# Patient Record
Sex: Female | Born: 1940 | Race: White | Hispanic: No | Marital: Married | Smoking: Never smoker
Health system: Southern US, Community
[De-identification: ages and names within clinical notes are randomized; demographics above are authoritative.]

## PROBLEM LIST (undated history)

## (undated) DIAGNOSIS — M1990 Unspecified osteoarthritis, unspecified site: Secondary | ICD-10-CM

## (undated) DIAGNOSIS — I499 Cardiac arrhythmia, unspecified: Secondary | ICD-10-CM

## (undated) DIAGNOSIS — I10 Essential (primary) hypertension: Secondary | ICD-10-CM

## (undated) DIAGNOSIS — C801 Malignant (primary) neoplasm, unspecified: Secondary | ICD-10-CM

---

## 2008-11-22 HISTORY — PX: JOINT REPLACEMENT: SHX530

## 2009-07-23 ENCOUNTER — Inpatient Hospital Stay (HOSPITAL_COMMUNITY): Admission: RE | Admit: 2009-07-23 | Discharge: 2009-07-26 | Payer: Self-pay | Admitting: Orthopedic Surgery

## 2010-12-13 IMAGING — CR DG CHEST 2V
2 series · 2 of 2 positions shown · non-contrast
Comparison: None

CLINICAL DATA: Preoperative respiratory exam for knee surgery.
Hypertension.  History of pneumonia.

CHEST - 2 VIEW

[view not recorded (1 of 2)]
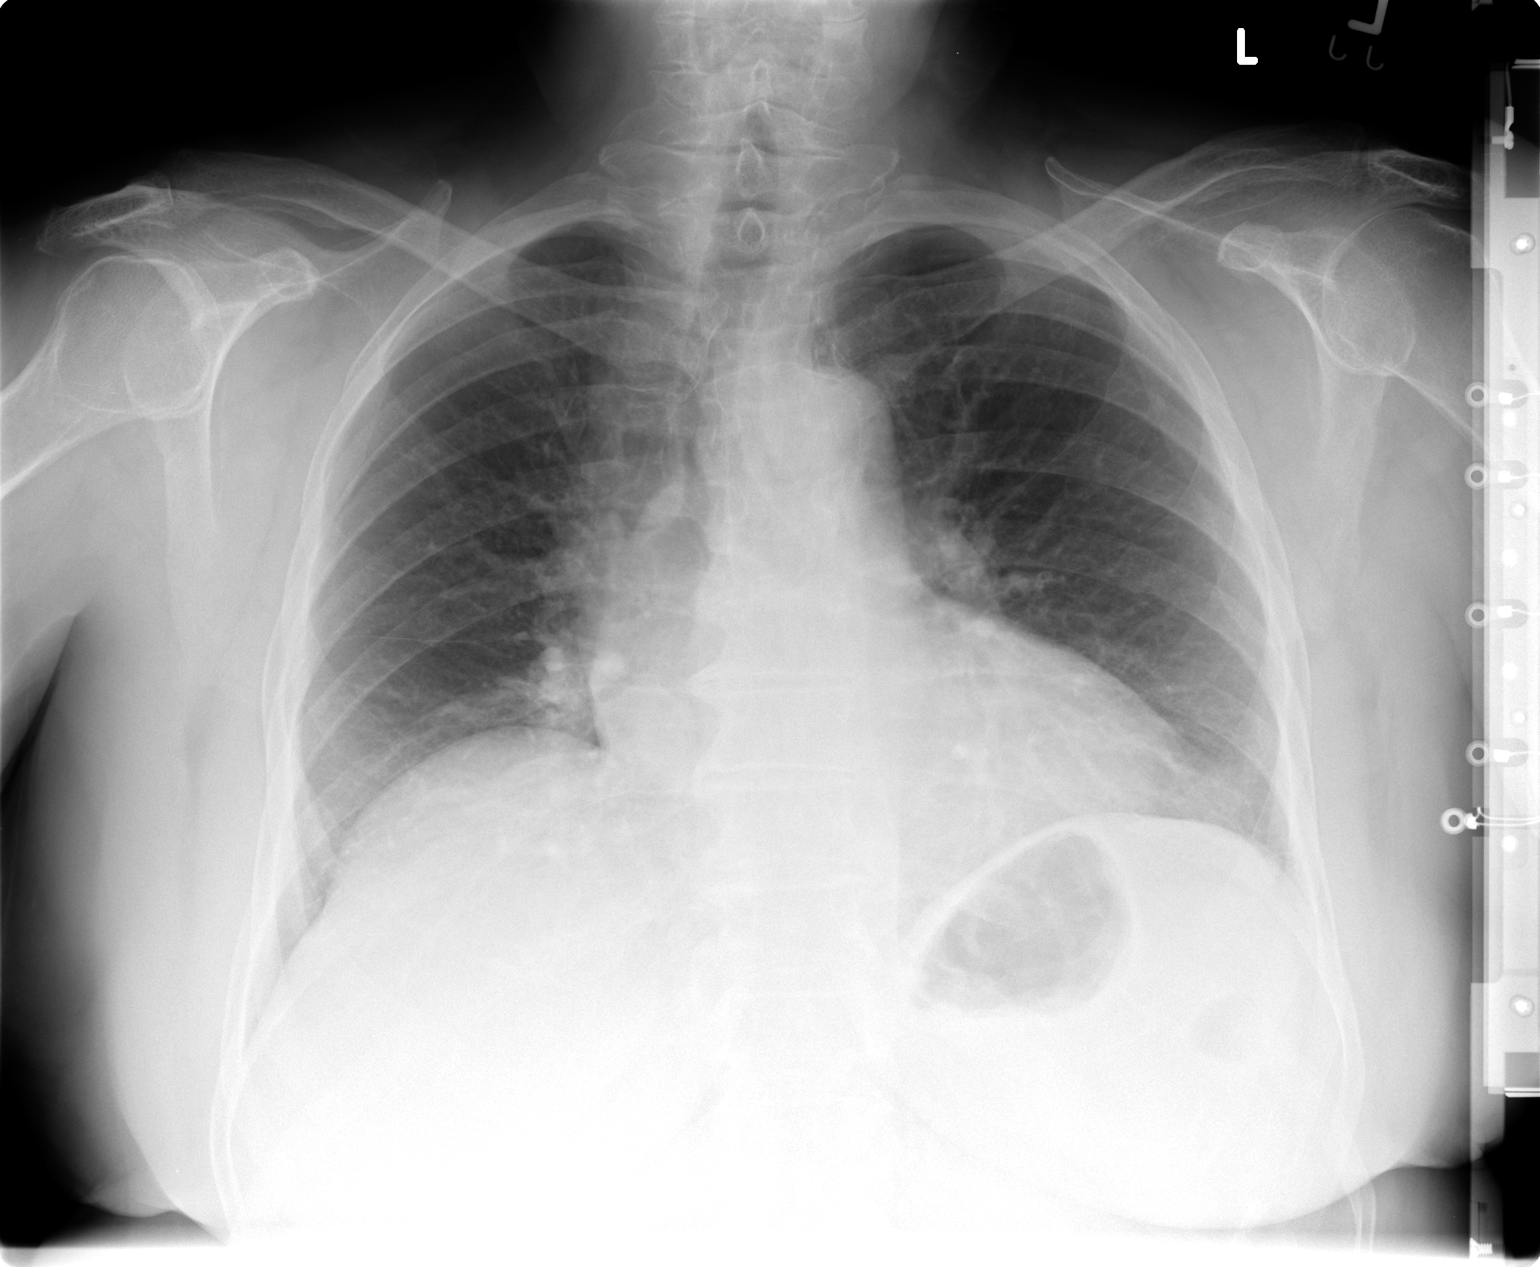

[view not recorded (2 of 2)]
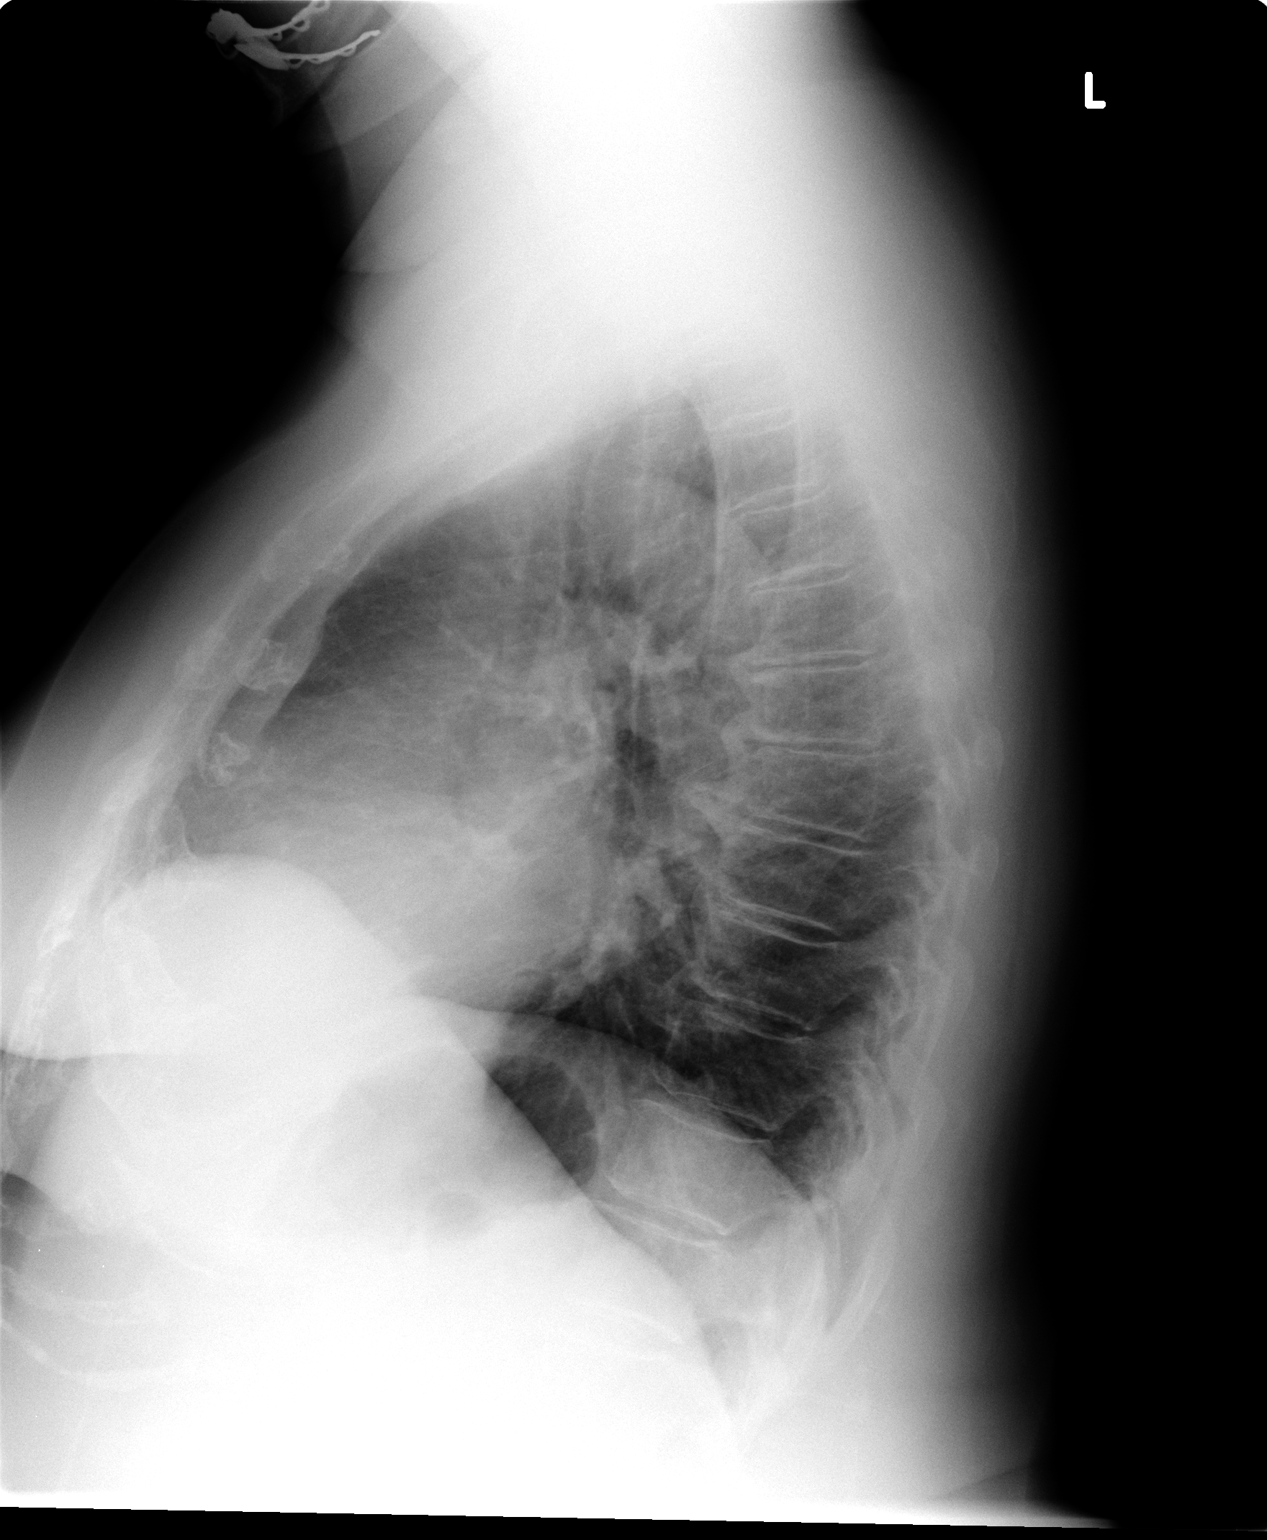

[2 of 2 positions shown; findings below may reference images not displayed]

FINDINGS: Heart size is at the upper limits of normal.  The
mediastinum is unremarkable.  Lungs are clear.  No effusions.
Ordinary degenerative changes effect the spine.
IMPRESSION: No active disease

## 2011-02-26 LAB — TYPE AND SCREEN: ABO/RH(D): O NEG

## 2011-02-26 LAB — BASIC METABOLIC PANEL
BUN: 10 mg/dL (ref 6–23)
BUN: 13 mg/dL (ref 6–23)
Calcium: 8.6 mg/dL (ref 8.4–10.5)
Calcium: 8.8 mg/dL (ref 8.4–10.5)
Chloride: 103 mEq/L (ref 96–112)
Creatinine, Ser: 0.79 mg/dL (ref 0.4–1.2)
Creatinine, Ser: 0.8 mg/dL (ref 0.4–1.2)
Creatinine, Ser: 0.82 mg/dL (ref 0.4–1.2)
GFR calc Af Amer: >60 mL/min (ref >60)
GFR calc non Af Amer: >60 mL/min (ref >60)
Glucose, Bld: 132 mg/dL — ABNORMAL HIGH (ref 70–99)
Sodium: 133 mEq/L — ABNORMAL LOW (ref 135–145)

## 2011-02-26 LAB — CBC
Hemoglobin: 9.6 g/dL — ABNORMAL LOW (ref 12.0–15.0)
MCV: 89.5 fL (ref 78.0–100.0)
Platelets: 150 10*3/uL (ref 150–400)
Platelets: 204 10*3/uL (ref 150–400)
Platelets: 213 10*3/uL (ref 150–400)
RBC: 3.01 MIL/uL — ABNORMAL LOW (ref 3.87–5.11)
RDW: 13.6 % (ref 11.5–15.5)
WBC: 8.4 10*3/uL (ref 4.0–10.5)
WBC: 9.7 10*3/uL (ref 4.0–10.5)

## 2011-02-26 LAB — PROTIME-INR
INR: 1.4 (ref 0.00–1.49)
INR: 1.5 (ref 0.00–1.49)
Prothrombin Time: 13.5 seconds (ref 11.6–15.2)
Prothrombin Time: 17.1 seconds — ABNORMAL HIGH (ref 11.6–15.2)
Prothrombin Time: 18.2 seconds — ABNORMAL HIGH (ref 11.6–15.2)

## 2011-02-26 LAB — ABO/RH: ABO/RH(D): O NEG

## 2011-02-27 LAB — COMPREHENSIVE METABOLIC PANEL
ALT: 15 U/L (ref 0–35)
Calcium: 9.4 mg/dL (ref 8.4–10.5)
Creatinine, Ser: 0.83 mg/dL (ref 0.4–1.2)
GFR calc non Af Amer: >60 mL/min (ref >60)
Glucose, Bld: 92 mg/dL (ref 70–99)
Sodium: 137 mEq/L (ref 135–145)
Total Protein: 7.2 g/dL (ref 6.0–8.3)

## 2011-02-27 LAB — URINALYSIS, ROUTINE W REFLEX MICROSCOPIC
Bilirubin Urine: NEGATIVE
Nitrite: POSITIVE — AB
Protein, ur: NEGATIVE mg/dL
Specific Gravity, Urine: 1.017 (ref 1.005–1.030)
Urobilinogen, UA: 0.2 mg/dL (ref 0.0–1.0)

## 2011-02-27 LAB — URINE MICROSCOPIC-ADD ON

## 2011-02-27 LAB — CBC
Hemoglobin: 12.7 g/dL (ref 12.0–15.0)
MCHC: 34.3 g/dL (ref 30.0–36.0)
MCV: 89 fL (ref 78.0–100.0)
RDW: 13.9 % (ref 11.5–15.5)

## 2011-02-27 LAB — PROTIME-INR
INR: 0.9 (ref 0.00–1.49)
Prothrombin Time: 12.1 seconds (ref 11.6–15.2)

## 2011-02-27 LAB — APTT: aPTT: 24 seconds (ref 24–37)

## 2011-04-06 NOTE — H&P (Signed)
Lisa Sims, Lisa Sims                  ACCOUNT NO.:  [id number]   MEDICAL RECORD NO.:  [id number]          PATIENT TYPE:  INP   LOCATION:  NA                           FACILITY:  [facility]   PHYSICIAN:  Ollen Gross, M.D.    DATE OF BIRTH:  1941/04/06   DATE OF ADMISSION:  DATE OF DISCHARGE:                              HISTORY & PHYSICAL   CHIEF COMPLAINT:  Bilateral knee pain worse in the left than the right.   HISTORY OF PRESENT ILLNESS:  Lisa Sims has been followed by Dr. Lequita Halt  for worsening bilateral knee pain.  The patient states that the pain  seems to be worse in her left knee than in her right.  The patient  states that at this point her knee pain is preventing her from doing  things.  She is here today to discuss options about her future.  X-rays  of the patient's knees, AP and lateral, reveal significant end-stage  arthritis in both knees that happens to be worse on the left than the  right.  Patient is bone-on-bone.  The patient now presents for left  total knee arthroplasty to be performed by Dr. Lequita Halt on July 23, 2009, at [facility].   ALLERGIES:  1. VICODIN causes nausea, vomiting and dizziness.  2. The patient is able to take Percocet.  3. The patient denies any food, latex or metal allergies.   CURRENT MEDICATIONS:  1. Baclofen 10 mg 1/2 tablet p.o. t.i.d.  2. Lisinopril 10 mg 1 tablet p.o. daily.  3. Hydrochloride 125 mg 1 tablet p.o. daily.  4. Fish Oil 1200 mg 1 tablet p.o. b.i.d.  5. Calcium citrate 600 mg 1 tablet p.o. daily.  6. Garlic 1000 mg 1 tablet p.o. daily.  Note, discussed with patient that she is to discontinue use of all  supplements 7 days prior to surgery.   PAST MEDICAL HISTORY:  1. Trigeminal neuralgia.  2. Dentures - full set on top, partial on bottom.  3. Hypertension.  4. Heart murmur.  5. Hyperlipidemia.  6. Bronchitis.  7. Pneumonia.  8. Varicose veins.  9. Hemorrhoids.  10.Arthritis.   REVIEW OF SYSTEMS:   GENERAL:  Negative for weight change, negative for  fevers, negative for night sweats.  HEENT/NEURO:  Positive for  headaches.  The patient notes that this is occasional and is most often  caused by her trigeminal neuralgia.  Negative for double vision,  negative for balance problems.  RESPIRATORY:  Negative for shortness of  breath at rest or with exertion.  Negative for cough or wheezing.  CARDIOVASCULAR:  Negative for chest pain, palpation.  The patient's last  electrocardiogram was this month.  The patient was then sent to the  hospital for an echocardiogram which was negative.  GASTROINTESTINAL:  Negative for nausea, vomiting, diarrhea, negative for blood in the  stool.  GENITOURINARY: Negative for painful urination, positive for a  weak stream and positive for urinating at nighttime.  MUSCULOSKELETAL:  Positive for joint pain and joint swelling, positive for morning  stiffness.   PAST SURGICAL HISTORY:  None.   FAMILY MEDICAL HISTORY:  Father 35, passed away of a myocardial  infarction.  Mother 44, passed away Alzheimer's, otherwise good health.   SOCIAL HISTORY:  The patient is married.  She is retired.  The patient  lives at home with her husband.  After surgery the patient plans to go  home.  She has caregiver lined up, it will be her daughter and her  husband.   PHYSICAL EXAMINATION:  VITAL SIGNS:  Pulse 78, respirations 18, blood  pressure 150/82.  GENERAL:  A 70 year old white female well-developed, well-nourished, in  no acute distress.  She is alert and oriented x3 and she is a good  historian.  HEENT:  Positive for glasses, positive for dentures, otherwise  unremarkable.  NECK:  Supple.  Full range of motion without lymphadenopathy.  CHEST:  Lungs are clear to auscultation bilaterally without wheezes.  HEART:  Regular rate and rhythm without murmur.  ABDOMEN:  Soft, nontender, nondistended.  Bowel sounds are present in  all 4 quadrants.  EXTREMITIES:  Left knee  marked crepitus on range of motion.  No  effusion.  Range is about 5-120 degrees.  No instability with palpation.  SKIN:  Unremarkable.  NEUROLOGIC:  Sensation in lower extremities is intact bilaterally.  PERIPHERAL VASCULAR:  Carotid pulses 2+ bilaterally without bruit.  Dorsalis pedis pulses 2+ bilaterally.   IMPRESSION:  End-stage arthritis bilateral knees, worse on the left than  the right.   PLAN OF ACTION:  Left total knee arthroplasty to be performed by Dr.  Lequita Halt July 23, 2009, at [facility].  The patient will  undergo all preoperative labs and testing at [facility].   PRIMARY CARE PHYSICIAN:  Dr. Earlene Plater.   CARDIOLOGIST:  Dr. Alferd Patee.      Lisa Sims, [facility]      Ollen Gross, M.D.  Electronically Signed    LD/MEDQ  D:  07/22/2009  T:  07/22/2009  Job:  [id number]

## 2011-04-06 NOTE — Op Note (Signed)
Lisa Sims, Lisa Sims                  ACCOUNT NO.:  [id number]   MEDICAL RECORD NO.:  [id number]          PATIENT TYPE:  INP   LOCATION:  0002                         [facility]ACILITY[facility]   PHYSICIAN:  Ollen Gross, M.D.    DATE OF BIRTH:  13-Oct-1941   DATE OF PROCEDURE:  07/23/2009  DATE OF DISCHARGE:                               OPERATIVE REPORT   PREOPERATIVE DIAGNOSIS:  Osteoarthritis, left knee.   POSTOPERATIVE DIAGNOSIS:  Osteoarthritis, left knee.   PROCEDURE:  Left total knee arthroplasty.   SURGEON:  Ollen Gross, M.D.   ASSISTANT:  Avel Peace PA-C.   ANESTHESIA:  Spinal with Duramorph.   ESTIMATED BLOOD LOSS:  Minimal.   DRAINS:  None.   TOURNIQUET TIME:  33 minutes at 300 mmHg.   COMPLICATIONS:  None.   CONDITION:  Stable to recovery room.   BRIEF CLINICAL NOTE:  Lisa Sims is a 70 year old female who has severe  end-stage arthritis of the left knee with progressively worsening pain  and dysfunction.  She has failed nonoperative management and presents  now for left total knee arthroplasty.   PROCEDURE IN DETAIL:  After successful administration of spinal  anesthetic, a tourniquet is placed on her left thigh and left lower  extremity is prepped and draped in the usual sterile fashion.  Extremity  is wrapped in Esmarch, knee flexed, tourniquet inflated to 300 mmHg.  Midline incision was made with a 10 blade through subcutaneous tissue to  the level of the extensor mechanism.  A fresh blade is used make a  medial parapatellar arthrotomy.  Soft tissue on the proximal medial  tibia is periosteally elevated to the joint line with the knife and the  semimembranosus bursa with a Cobb elevator.  Soft tissue laterally is  elevated with attention being paid to avoiding the patellar tendon on  the tibial tubercle.  The patella is subluxed laterally, knee flexed 90  degrees, and ACL and PCL removed.  Drill was used to create a starting  hole in the distal femur and the  canal was thoroughly irrigated.  A 5  degree left valgus alignment guide is placed referencing off the  posterior condyles.  Rotations marked and the block pinned to remove 11  mm of the distal femur.  Distal femoral resection is made with an  oscillating saw.  Sizing block is placed, size 3 is most appropriate.  Rotations marked off the epicondylar axis.  Size 3 cutting block is  placed and the anterior, posterior and chamfer cuts were made.   Tibia subluxed forward and menisci removed.  Extramedullary tibial  alignment guide is placed referencing proximally at the medial aspect of  the tibial tubercle and distally along the second metatarsal axis and  tibial crest.  Blocks pinned to remove about 2 mm off the more deficient  medial side.  Tibial resection is made with an oscillating saw.  Size 4  is the most appropriate tibial component and the proximal tibia is  prepared with the modular drill and keel punch for the size 4.  Femoral  preparation was completed  the intercondylar cut for size 3.   Size 4 mobile bearing tibial trial, size 3 posterior stabilized femoral  trial and a 10-mm posterior stabilized rotating platform insert trial  are placed.  With the 10, full extension is achieved with excellent  varus-valgus and anterior-posterior balance throughout full range of  motion.  Patella was everted, thickness measured to 20 mm.  Freehand  resection taken to 12 mm, 35 template is placed, lug holes were drilled,  trial patella was placed and it tracks normally.  Osteophytes removed  off the posterior femur with the trial in place.  All trials were  removed and the cut bone surfaces are prepared with pulsatile lavage.  Cement was mixed and once ready for implantation the size 4 mobile  bearing tibial tray,size 3 posterior stabilized femur and 35 patella are  cemented into place.  The patella was held with a clamp.  Trial 10-mm  inserts placed, knee held in full extension and all  extruded cement  removed.  When the cement is fully hardened, then the permanent 10 mm  posterior stabilized rotating platform insert is placed in the tibial  tray.  Wounds copiously irrigated with saline solution and then the  FloSeal injected into the medial and lateral gutters and suprapatellar  area.  Moist sponge is placed and a tourniquet released for a total time  of 33 minutes.  Sponge is held for 2 minutes then removed.  Minimal  bleeding was encountered.  Bleeding that is encountered stopped with  electrocautery.  Wounds again irrigated and the arthrotomy was closed  with interrupted #1 PDS.  Flexion against gravity is 140 degrees.  Subcu  is then closed with interrupted 2-0 Vicryl and subcuticular running 4-0  Monocryl.  The incision was then cleaned and dried and Steri-Strips and  bulky sterile dressing were applied.  She is then placed into a knee  immobilizer, awakened and transferred to recovery in stable condition.      Ollen Gross, M.D.  Electronically Signed     FA/MEDQ  D:  07/23/2009  T:  07/23/2009  Job:  [id number]

## 2011-11-23 DIAGNOSIS — C801 Malignant (primary) neoplasm, unspecified: Secondary | ICD-10-CM

## 2011-11-23 HISTORY — DX: Malignant (primary) neoplasm, unspecified: C80.1

## 2017-10-30 ENCOUNTER — Ambulatory Visit: Payer: Self-pay | Admitting: Orthopedic Surgery

## 2017-12-07 ENCOUNTER — Encounter (HOSPITAL_COMMUNITY): Admission: RE | Admit: 2017-12-07 | Payer: Medicare Other | Source: Ambulatory Visit

## 2017-12-12 ENCOUNTER — Inpatient Hospital Stay (HOSPITAL_COMMUNITY): Admission: RE | Admit: 2017-12-12 | Payer: Medicare Other | Source: Ambulatory Visit | Admitting: Orthopedic Surgery

## 2017-12-12 ENCOUNTER — Encounter (HOSPITAL_COMMUNITY): Admission: RE | Payer: Self-pay | Source: Ambulatory Visit

## 2017-12-12 SURGERY — ARTHROPLASTY, KNEE, TOTAL
Anesthesia: Choice | Site: Knee | Laterality: Right

## 2018-01-20 DEATH — deceased

## 2018-01-26 ENCOUNTER — Ambulatory Visit: Payer: Self-pay | Admitting: Orthopedic Surgery

## 2018-03-05 ENCOUNTER — Ambulatory Visit: Payer: Self-pay | Admitting: Orthopedic Surgery

## 2018-03-05 NOTE — H&P (Signed)
NETTIE, WYFFELS [phone], F) DOB June 23, 1941   Chief Complaint Right Knee Pain H&P right TKA on 03/20/2018 at [facility] Primary Care Provider: [facility]: [place], [place], [place] [address], Ph [phone], Fax [phone] NPI: [id number] Patient[facility] Pharm[facility] [address] [facility]): [place], [place] [address], Ph [phone], Fax [phone]   Vitals Ht: 5 ft 6 in Wt: 221 lbs  BMI: 35.7  BP: 146/68  Pulse: 64 bpm regular  Allergies Reviewed Allergies NKDA  Medications Reviewed Medications amiodarone 200 mg tablet 12/23/17   filled [place] 02/21/18   entered Vanessa Cumine cyanocobalamin (vit B-12) 1,000 mcg tablet Take by oral route. 02/21/18   entered [place]ica Cumine Eliquis 5 mg tablet 02/13/18   filled [place] gabapentin 100 mg capsule 01/10/18   filled [place] hydroCHLOROthiazide 12.5 mg capsule 01/30/18   filled [place] lisinopril 10 mg tablet 01/20/18   filled [place] metoprolol succinate ER 25 mg tablet,extended release 24 hr 02/13/18   filled [place] pravastatin 40 mg tablet 02/09/18   filled [place]   Problems Reviewed Problems Osteoarthritis - Onset:  Family History Reviewed Family History Father - Congestive heart failure Brother - Essential hypertension Sister - Family history of cancer Mother - Cerebrovascular accident  Social History Reviewed Social History Smoking Status: Never smoker Alcohol intake: None Work related injury?: N Advance directive: N Freight forwarder of Attorney: Y  Surgical History Reviewed Surgical History Knee Joint Replacement - Left - 2010  Past Medical History Reviewed Past Medical History Heart Problems: Y High Cholesterol: Y Irregular Heartbeat: Y Joint Pain: Y Notes: Shingles,  History of Pneumonia,  Heart Murmur,  Atrial  Fib, Varicose Veins,  Skin Cancer,  Memopausal,  Childhood Measels,  Childhood Mumps    HPI The patient is here today for a pre-operative History and Physical. They are scheduled for right total knee replacement on 03/20/2018 with Dr. Wynelle Link at [facility]. Patient is a 77 year old female who presented with knee complaints. The patient reports right knee symptoms including: pain which began over a year ago without any known injury. The patient describes the severity of the symptoms as mild. The patient describes their pain as sharp. Onset of symptoms was with symptoms now occurring intermittently. She did have 2 cortisone injections into her knee (the last injection was August 8th 2018). Ms. Bachicha is having increasing problems with her RIGHT knee. I previously replaced her LEFT knee she did great with that. RIGHT knee is hurting as badly as the LEFT one did prior to when she had a fixed. It is giving out on her. Limiting what she can and cannot do. The injections have not been beneficial. AP and lateral of the knees show the prosthesis on the LEFT in excellent position with no periprosthetic abnormalities. On the RIGHT she has bone-on-bone arthritis in the medial and patellofemoral compartments. At this point, the most predictable means of improving pain and function is total knee arthroplasty. The procedure, risks, potential complications and rehab course are discussed in detail and the patient elects to proceed.   ROS Constitutional: Constitutional: no significant weight gain or loss and no fever.  HEENT: Eyes: Visual Problems.  Cardiovascular: Cardiovascular: no palpitations or chest pain; Irregular Heart Rate.  Respiratory: Respiratory: no cough or shortness of breath and No COPD.  Gastrointestinal: Gastrointestinal: no vomiting or diarrhea  and not vomiting blood.  Genitourinary: Genitourinary: no blood in urine or difficulty urinating.  Musculoskeletal: Musculoskeletal:  Joint Pain; back pain.  Hematologic/Lymphatic: Hematologic/Lymphatic Bleed Easily.   Physical Exam Patient is a 77 year old female.  General Mental Status - Alert, cooperative and good historian. General Appearance - pleasant, Not in acute distress. Orientation - Oriented X3. Build & Nutrition - Well nourished and Well developed.  Head and Neck Head - normocephalic, atraumatic . Neck Global Assessment - supple, no bruit auscultated on the right, no bruit auscultated on the left.  Eye Pupil - Bilateral - PERR Motion - Bilateral - EOMI.  Chest and Lung Exam Auscultation Breath sounds - clear at anterior chest wall and clear at posterior chest wall. Adventitious sounds - No Adventitious sounds.  Cardiovascular Auscultation Rhythm - Regular rate and rhythm. Heart Sounds - S1 WNL and S2 WNL. Murmurs & Other Heart Sounds - Auscultation of the heart reveals - No Murmurs.  Abdomen Palpation/Percussion Tenderness - Abdomen is non-tender to palpation. Abdomen is soft. Auscultation Auscultation of the abdomen reveals - Bowel sounds normal. - Round, slighty protuberant abdomen.  Genitourinary Note: Not done, not pertinent to present illness  Musculoskeletal Evaluation of the left hip shows flexion to 120 rotation in 30 out 40 and abduction 40 without discomfort. There is no tenderness over the greater trochanter. There is no pain on provocative testing of the hip.Examination of the right hip shows flexion to 120 rotation in 30 abduction 40 and external rotation of 40. There is no tenderness over the greater trochanter. There is no pain on provocative testing of the hip. Her LEFT knee shows no effusion. Her range is 0-125 years no tenderness or instability. RIGHT knee shows varus deformity. Range 10-100 with marked crepitus on range of motion tenderness medial greater than lateral with no instability. Her gait pattern is antalgic on the RIGHT.  Radiographs-AP and lateral of the knees  show the prosthesis on the LEFT in excellent position with no periprosthetic abnormalities. On the RIGHT she has bone-on-bone arthritis in the medial and patellofemoral compartments.  Assessment / Plan 1. Osteoarthritis of right knee joint M17.11: Unilateral primary osteoarthritis, right knee  Goals Patient Instructions Surgical Plans: Left Right Total Knee Hip Replacement - Anterior Approach Disposition: Home, Straight to Outpatient Therapy at [facility] in [place], [place] PCP: Dr. Alean Rinne - seen and cleared to proceed with surgery Dr. Dwyane Dee - seen and cleared to proceed with surgery. IV TXA Anesthesia Issues: None Patient was instructed on what medications to stop prior to surgery. - Follow up visit in 2 weeks with Dr. Wynelle Link - Begin physical therapy following surgery - Pre-operative lab work as pre Pre-Surgical Testing - Prescriptions will be provided in hospital at time of discharge  Anticipated LOS equal to or greater than 2 midnights due to - Age 90 and older with one or more of the following:  - Obesity  - Expected need for hospital services (PT, OT, Nursing) required for safe  discharge  - Anticipated need for postoperative skilled nursing care or inpatient rehab  - Active co-morbidities:  Heart Problems:  High Cholesterol:  Irregular Heartbeat:  Return to Office Gaynelle Arabian, MD for [facility] at [facility] on 04/04/2018 at 01:00 PM  Encounter signed-off by Mickel Crow, PA-C

## 2018-03-05 NOTE — H&P (View-Only) (Signed)
Lisa Sims, Lisa Sims [phone], F) DOB [birth year]   Chief Complaint Right Knee Pain H&P right TKA on 03/20/2018 at [facility] Primary Care Provider: [place] [facility]: [employer], [place],  [address], Ph [phone], Fax [phone] NPI: [id number] [facility] [address] [facility]): [place], [place]  [address], Ph [phone], Fax [phone]   Vitals Ht: 5 ft 6 in Wt: 221 lbs  BMI: 35.7  BP: 146/68  Pulse: 64 bpm regular  Allergies Reviewed Allergies NKDA  Medications Reviewed Medications amiodarone 200 mg tablet 12/23/17   filled [place] 02/21/18   entered Vanessa Cumine cyanocobalamin (vit B-12) 1,000 mcg tablet Take by oral route. 02/21/18   entered Dominica Cumine Eliquis 5 mg tablet 02/13/18   filled [place] gabapentin 100 mg capsule 01/10/18   filled [place] hydroCHLOROthiazide 12.5 mg capsule 01/30/18   filled [place] lisinopril 10 mg tablet 01/20/18   filled [place] metoprolol succinate ER 25 mg tablet,extended release 24 hr 02/13/18   filled [place] pravastatin 40 mg tablet 02/09/18   filled [place]   Problems Reviewed Problems Osteoarthritis - Onset:  Family History Reviewed Family History Father - Congestive heart failure Brother - Essential hypertension Sister - Family history of cancer Mother - Cerebrovascular accident  Social History Reviewed Social History Smoking Status: Never smoker Alcohol intake: None Work related injury?: N Advance directive: N Freight forwarder of Attorney: Y  Surgical History Reviewed Surgical History Knee Joint Replacement - Left - 2010  Past Medical History Reviewed Past Medical History Heart Problems: Y High Cholesterol: Y Irregular Heartbeat: Y Joint Pain: Y Notes: Shingles,  History of Pneumonia,  Heart Murmur,  Atrial  Fib, Varicose Veins,  Skin Cancer,  Memopausal,  Childhood Measels,  Childhood Mumps    HPI The patient is here today for a pre-operative History and Physical. They are scheduled for right total knee replacement on 03/20/2018 with Dr. Wynelle Link at [facility]. Patient is a 77 year old female who presented with knee complaints. The patient reports right knee symptoms including: pain which began over a year ago without any known injury. The patient describes the severity of the symptoms as mild. The patient describes their pain as sharp. Onset of symptoms was with symptoms now occurring intermittently. She did have 2 cortisone injections into her knee (the last injection was August 8th 2018). [name] is having increasing problems with her RIGHT knee. I previously replaced her LEFT knee she did great with that. RIGHT knee is hurting as badly as the LEFT one did prior to when she had a fixed. It is giving out on her. Limiting what she can and cannot do. The injections have not been beneficial. AP and lateral of the knees show the prosthesis on the LEFT in excellent position with no periprosthetic abnormalities. On the RIGHT she has bone-on-bone arthritis in the medial and patellofemoral compartments. At this point, the most predictable means of improving pain and function is total knee arthroplasty. The procedure, risks, potential complications and rehab course are discussed in detail and the patient elects to proceed.   ROS Constitutional: Constitutional: no significant weight gain or loss and no fever.  HEENT: Eyes: Visual Problems.  Cardiovascular: Cardiovascular: no palpitations or chest pain; Irregular Heart Rate.  Respiratory: Respiratory: no cough or shortness of breath and No COPD.  Gastrointestinal: Gastrointestinal: no vomiting or diarrhea  and not vomiting blood.  Genitourinary: Genitourinary: no blood in urine or difficulty urinating.  Musculoskeletal: Musculoskeletal:  Joint Pain; back pain.  Hematologic/Lymphatic: Hematologic/Lymphatic Bleed Easily.   Physical Exam Patient is a 77 year old female.  General Mental Status - Alert, cooperative and good historian. General Appearance - pleasant, Not in acute distress. Orientation - Oriented X3. Build & Nutrition - Well nourished and Well developed.  Head and Neck Head - normocephalic, atraumatic . Neck Global Assessment - supple, no bruit auscultated on the right, no bruit auscultated on the left.  Eye Pupil - Bilateral - PERR Motion - Bilateral - EOMI.  Chest and Lung Exam Auscultation Breath sounds - clear at anterior chest wall and clear at posterior chest wall. Adventitious sounds - No Adventitious sounds.  Cardiovascular Auscultation Rhythm - Regular rate and rhythm. Heart Sounds - S1 WNL and S2 WNL. Murmurs & Other Heart Sounds - Auscultation of the heart reveals - No Murmurs.  Abdomen Palpation/Percussion Tenderness - Abdomen is non-tender to palpation. Abdomen is soft. Auscultation Auscultation of the abdomen reveals - Bowel sounds normal. - Round, slighty protuberant abdomen.  Genitourinary Note: Not done, not pertinent to present illness  Musculoskeletal Evaluation of the left hip shows flexion to 120 rotation in 30 out 40 and abduction 40 without discomfort. There is no tenderness over the greater trochanter. There is no pain on provocative testing of the hip.Examination of the right hip shows flexion to 120 rotation in 30 abduction 40 and external rotation of 40. There is no tenderness over the greater trochanter. There is no pain on provocative testing of the hip. Her LEFT knee shows no effusion. Her range is 0-125 years no tenderness or instability. RIGHT knee shows varus deformity. Range 10-100 with marked crepitus on range of motion tenderness medial greater than lateral with no instability. Her gait pattern is antalgic on the RIGHT.  Radiographs-AP and lateral of the knees  show the prosthesis on the LEFT in excellent position with no periprosthetic abnormalities. On the RIGHT she has bone-on-bone arthritis in the medial and patellofemoral compartments.  Assessment / Plan 1. Osteoarthritis of right knee joint M17.11: Unilateral primary osteoarthritis, right knee  Goals Patient Instructions Surgical Plans: Left Right Total Knee Hip Replacement - Anterior Approach Disposition: Home, Straight to Outpatient Therapy at [facility] in [place], [place] PCP: Dr. Alean Rinne - seen and cleared to proceed with surgery Dr. Dwyane Dee - seen and cleared to proceed with surgery. IV TXA Anesthesia Issues: None Patient was instructed on what medications to stop prior to surgery. - Follow up visit in 2 weeks with Dr. Wynelle Link - Begin physical therapy following surgery - Pre-operative lab work as pre Pre-Surgical Testing - Prescriptions will be provided in hospital at time of discharge  Anticipated LOS equal to or greater than 2 midnights due to - Age 39 and older with one or more of the following:  - Obesity  - Expected need for hospital services (PT, OT, Nursing) required for safe  discharge  - Anticipated need for postoperative skilled nursing care or inpatient rehab  - Active co-morbidities:  Heart Problems:  High Cholesterol:  Irregular Heartbeat:  Return to Office Gaynelle Arabian, MD for [facility] on 04/04/2018 at 01:00 PM  Encounter signed-off by Mickel Crow, PA-C

## 2018-03-13 NOTE — Patient Instructions (Addendum)
Lisa Sims  03/13/2018   Your procedure is scheduled on: 03-20-18   Report to [facility] Main  Entrance Report to Admitting at 5:30 AM    Call this number if you have problems the morning of surgery [phone]   Remember: Do not eat food or drink liquids :After Midnight.     Take these medicines the morning of surgery with A SIP OF WATER: Amiodarone (Pacerone), Gabapentin (Neurontin), and Metoprolol Succinate.                                 You may not have any metal on your body including hair pins and              piercings  Do not wear jewelry, make-up, lotions, powders or perfumes, deodorant             Do not wear nail polish.  Do not shave  48 hours prior to surgery.                Do not bring valuables to the hospital. [place].  Contacts, dentures or bridgework may not be worn into surgery.  Leave suitcase in the car. After surgery it may be brought to your room.  :              Please read over the following fact sheets you were given: _____________________________________________________________________         [facility] - Preparing for Surgery Before surgery, you can play an important role.  Because skin is not sterile, your skin needs to be as free of germs as possible.  You can reduce the number of germs on your skin by washing with CHG (chlorahexidine gluconate) soap before surgery.  CHG is an antiseptic cleaner which kills germs and bonds with the skin to continue killing germs even after washing. Please DO NOT use if you have an allergy to CHG or antibacterial soaps.  If your skin becomes reddened/irritated stop using the CHG and inform your nurse when you arrive at Short Stay. Do not shave (including legs and underarms) for at least 48 hours prior to the first CHG shower.  You may shave your face/neck. Please follow these instructions carefully:  1.  Shower with CHG Soap the night  before surgery and the  morning of Surgery.  2.  If you choose to wash your hair, wash your hair first as usual with your  normal  shampoo.  3.  After you shampoo, rinse your hair and body thoroughly to remove the  shampoo.                           4.  Use CHG as you would any other liquid soap.  You can apply chg directly  to the skin and wash                       Gently with a scrungie or clean washcloth.  5.  Apply the CHG Soap to your body ONLY FROM THE NECK DOWN.   Do not use on face/ open  Wound or open sores. Avoid contact with eyes, ears mouth and genitals (private parts).                       Wash face,  Genitals (private parts) with your normal soap.             6.  Wash thoroughly, paying special attention to the area where your surgery  will be performed.  7.  Thoroughly rinse your body with warm water from the neck down.  8.  DO NOT shower/wash with your normal soap after using and rinsing off  the CHG Soap.                9.  Pat yourself dry with a clean towel.            10.  Wear clean pajamas.            11.  Place clean sheets on your bed the night of your first shower and do not  sleep with pets. Day of Surgery : Do not apply any lotions/deodorants the morning of surgery.  Please wear clean clothes to the hospital/surgery center.  FAILURE TO FOLLOW THESE INSTRUCTIONS MAY RESULT IN THE CANCELLATION OF YOUR SURGERY PATIENT SIGNATURE_________________________________  NURSE SIGNATURE__________________________________  ________________________________________________________________________   Lisa Sims  An incentive spirometer is a tool that can help keep your lungs clear and active. This tool measures how well you are filling your lungs with each breath. Taking long deep breaths may help reverse or decrease the chance of developing breathing (pulmonary) problems (especially infection) following:  A long period of time when you are  unable to move or be active. BEFORE THE PROCEDURE   If the spirometer includes an indicator to show your best effort, your nurse or respiratory therapist will set it to a desired goal.  If possible, sit up straight or lean slightly forward. Try not to slouch.  Hold the incentive spirometer in an upright position. INSTRUCTIONS FOR USE  1. Sit on the edge of your bed if possible, or sit up as far as you can in bed or on a chair. 2. Hold the incentive spirometer in an upright position. 3. Breathe out normally. 4. Place the mouthpiece in your mouth and seal your lips tightly around it. 5. Breathe in slowly and as deeply as possible, raising the piston or the ball toward the top of the column. 6. Hold your breath for 3-5 seconds or for as long as possible. Allow the piston or ball to fall to the bottom of the column. 7. Remove the mouthpiece from your mouth and breathe out normally. 8. Rest for a few seconds and repeat Steps 1 through 7 at least 10 times every 1-2 hours when you are awake. Take your time and take a few normal breaths between deep breaths. 9. The spirometer may include an indicator to show your best effort. Use the indicator as a goal to work toward during each repetition. 10. After each set of 10 deep breaths, practice coughing to be sure your lungs are clear. If you have an incision (the cut made at the time of surgery), support your incision when coughing by placing a pillow or rolled up towels firmly against it. Once you are able to get out of bed, walk around indoors and cough well. You may stop using the incentive spirometer when instructed by your caregiver.  RISKS AND COMPLICATIONS  Take your time so you do not get  dizzy or light-headed.  If you are in pain, you may need to take or ask for pain medication before doing incentive spirometry. It is harder to take a deep breath if you are having pain. AFTER USE  Rest and breathe slowly and easily.  It can be helpful to  keep track of a log of your progress. Your caregiver can provide you with a simple table to help with this. If you are using the spirometer at home, follow these instructions: [place] IF:   You are having difficultly using the spirometer.  You have trouble using the spirometer as often as instructed.  Your pain medication is not giving enough relief while using the spirometer.  You develop fever of 100.5 F (38.1 C) or higher. SEEK IMMEDIATE MEDICAL CARE IF:   You cough up bloody sputum that had not been present before.  You develop fever of 102 F (38.9 C) or greater.  You develop worsening pain at or near the incision site. MAKE SURE YOU:   Understand these instructions.  Will watch your condition.  Will get help right away if you are not doing well or get worse. Document Released: 03/21/2007 Document Revised: 01/31/2012 Document Reviewed: 05/22/2007 ExitCare Patient Information 2014 ExitCare, [place].   ________________________________________________________________________  WHAT IS A BLOOD TRANSFUSION? Blood Transfusion Information  A transfusion is the replacement of blood or some of its parts. Blood is made up of multiple cells which provide different functions.  Red blood cells carry oxygen and are used for blood loss replacement.  White blood cells fight against infection.  Platelets control bleeding.  Plasma helps clot blood.  Other blood products are available for specialized needs, such as hemophilia or other clotting disorders. BEFORE THE TRANSFUSION  Who gives blood for transfusions?   Healthy volunteers who are fully evaluated to make sure their blood is safe. This is blood bank blood. Transfusion therapy is the safest it has ever been in the practice of medicine. Before blood is taken from a donor, a complete history is taken to make sure that person has no history of diseases nor engages in risky social behavior (examples are intravenous drug  use or sexual activity with multiple partners). The donor's travel history is screened to minimize risk of transmitting infections, such as malaria. The donated blood is tested for signs of infectious diseases, such as HIV and hepatitis. The blood is then tested to be sure it is compatible with you in order to minimize the chance of a transfusion reaction. If you or a relative donates blood, this is often done in anticipation of surgery and is not appropriate for emergency situations. It takes many days to process the donated blood. RISKS AND COMPLICATIONS Although transfusion therapy is very safe and saves many lives, the main dangers of transfusion include:   Getting an infectious disease.  Developing a transfusion reaction. This is an allergic reaction to something in the blood you were given. Every precaution is taken to prevent this. The decision to have a blood transfusion has been considered carefully by your caregiver before blood is given. Blood is not given unless the benefits outweigh the risks. AFTER THE TRANSFUSION  Right after receiving a blood transfusion, you will usually feel much better and more energetic. This is especially true if your red blood cells have gotten low (anemic). The transfusion raises the level of the red blood cells which carry oxygen, and this usually causes an energy increase.  The nurse administering the transfusion will  monitor you carefully for complications. HOME CARE INSTRUCTIONS  No special instructions are needed after a transfusion. You may find your energy is better. Speak with your caregiver about any limitations on activity for underlying diseases you may have. SEEK MEDICAL CARE IF:   Your condition is not improving after your transfusion.  You develop redness or irritation at the intravenous (IV) site. SEEK IMMEDIATE MEDICAL CARE IF:  Any of the following symptoms occur over the next 12 hours:  Shaking chills.  You have a temperature by mouth  above 102 F (38.9 C), not controlled by medicine.  Chest, back, or muscle pain.  People around you feel you are not acting correctly or are confused.  Shortness of breath or difficulty breathing.  Dizziness and fainting.  You get a rash or develop hives.  You have a decrease in urine output.  Your urine turns a dark color or changes to pink, red, or [place]. Any of the following symptoms occur over the next 10 days:  You have a temperature by mouth above 102 F (38.9 C), not controlled by medicine.  Shortness of breath.  Weakness after normal activity.  The white part of the eye turns yellow (jaundice).  You have a decrease in the amount of urine or are urinating less often.  Your urine turns a dark color or changes to pink, red, or [place]. Document Released: 11/05/2000 Document Revised: 01/31/2012 Document Reviewed: 06/24/2008 [facility] Patient Information 2014 [place], [place].  _______________________________________________________________________

## 2018-03-13 NOTE — Progress Notes (Signed)
01-20-18 EKG on chart 12-29-17 EKG on chart 12-23-17 EKG on chart  12-29-17 Cardiac clearance on chart from Dr. Leonia Corona  02-16-18 Surgical clearance from Dr. Rosana Hoes on chart

## 2018-03-14 ENCOUNTER — Encounter (HOSPITAL_COMMUNITY): Payer: Self-pay | Admitting: *Deleted

## 2018-03-14 ENCOUNTER — Encounter (HOSPITAL_COMMUNITY)
Admission: RE | Admit: 2018-03-14 | Discharge: 2018-03-14 | Disposition: A | Discharge status: Home / Self Care | Payer: Medicare Other | Source: Ambulatory Visit | Attending: Orthopedic Surgery | Admitting: Orthopedic Surgery

## 2018-03-14 ENCOUNTER — Other Ambulatory Visit: Payer: Self-pay

## 2018-03-14 DIAGNOSIS — Z01812 Encounter for preprocedural laboratory examination: Secondary | ICD-10-CM | POA: Insufficient documentation

## 2018-03-14 HISTORY — DX: Unspecified osteoarthritis, unspecified site: M19.90

## 2018-03-14 HISTORY — DX: Cardiac arrhythmia, unspecified: I49.9

## 2018-03-14 HISTORY — DX: Malignant (primary) neoplasm, unspecified: C80.1

## 2018-03-14 HISTORY — DX: Essential (primary) hypertension: I10

## 2018-03-14 LAB — CBC
HEMATOCRIT: 35.2 % — AB (ref 36.0–46.0)
Hemoglobin: 11.3 g/dL — ABNORMAL LOW (ref 12.0–15.0)
MCH: 29.9 pg (ref 26.0–34.0)
MCHC: 32.1 g/dL (ref 30.0–36.0)
MCV: 93.1 fL (ref 78.0–100.0)
Platelets: 214 10*3/uL (ref 150–400)
RBC: 3.78 MIL/uL — ABNORMAL LOW (ref 3.87–5.11)
RDW: 13.8 % (ref 11.5–15.5)
WBC: 6.9 10*3/uL (ref 4.0–10.5)

## 2018-03-14 LAB — COMPREHENSIVE METABOLIC PANEL
ALBUMIN: 4 g/dL (ref 3.5–5.0)
ALT: 14 U/L (ref 14–54)
ANION GAP: 9 (ref 5–15)
AST: 19 U/L (ref 15–41)
Alkaline Phosphatase: 64 U/L (ref 38–126)
BILIRUBIN TOTAL: 0.5 mg/dL (ref 0.3–1.2)
BUN: 24 mg/dL — AB (ref 6–20)
CALCIUM: 8.9 mg/dL (ref 8.9–10.3)
CO2: 24 mmol/L (ref 22–32)
Chloride: 105 mmol/L (ref 101–111)
Creatinine, Ser: 1.26 mg/dL — ABNORMAL HIGH (ref 0.44–1.00)
GFR calc Af Amer: 46 mL/min — ABNORMAL LOW (ref >60)
GFR calc non Af Amer: 39 mL/min — ABNORMAL LOW (ref >60)
GLUCOSE: 95 mg/dL (ref 65–99)
POTASSIUM: 4.8 mmol/L (ref 3.5–5.1)
Sodium: 138 mmol/L (ref 135–145)
TOTAL PROTEIN: 7.4 g/dL (ref 6.5–8.1)

## 2018-03-14 LAB — PROTIME-INR
INR: 1.28
Prothrombin Time: 15.9 seconds — ABNORMAL HIGH (ref 11.4–15.2)

## 2018-03-14 LAB — SURGICAL PCR SCREEN
MRSA, PCR: NEGATIVE
STAPHYLOCOCCUS AUREUS: NEGATIVE

## 2018-03-14 LAB — APTT: APTT: 34 s (ref 24–36)

## 2018-03-14 NOTE — Pre-Procedure Instructions (Signed)
CBC, CMP, PT results 03/14/2018 faxed to Dr. Wynelle Link via epic.

## 2018-03-15 NOTE — Pre-Procedure Instructions (Signed)
Dr. Royce Macadamia made aware of PT 15.9, BUN 24, Creatinine 1.26 no new orders received at this time.

## 2018-03-19 MED ORDER — TRANEXAMIC ACID 1000 MG/10ML IV SOLN
1000.0000 mg | INTRAVENOUS | Status: AC
  Administered 2018-03-20: 1000 mg via INTRAVENOUS
  Filled 2018-03-19: qty 1100

## 2018-03-19 MED ORDER — BUPIVACAINE LIPOSOME 1.3 % IJ SUSP
20.0000 mL | Freq: Once | INTRAMUSCULAR | Status: DC
  Filled 2018-03-19: qty 20

## 2018-03-20 ENCOUNTER — Inpatient Hospital Stay (HOSPITAL_COMMUNITY)
Admission: RE | Admit: 2018-03-20 | Discharge: 2018-03-22 | DRG: 470 | Disposition: A | Discharge status: Home / Self Care | Payer: Medicare Other | Source: Ambulatory Visit | Attending: Orthopedic Surgery | Admitting: Orthopedic Surgery

## 2018-03-20 ENCOUNTER — Inpatient Hospital Stay (HOSPITAL_COMMUNITY): Payer: Medicare Other | Admitting: Anesthesiology

## 2018-03-20 ENCOUNTER — Encounter (HOSPITAL_COMMUNITY): Payer: Self-pay | Admitting: Emergency Medicine

## 2018-03-20 ENCOUNTER — Other Ambulatory Visit: Payer: Self-pay

## 2018-03-20 ENCOUNTER — Encounter (HOSPITAL_COMMUNITY)
Admission: RE | Disposition: A | Discharge status: Home / Self Care | Payer: Self-pay | Source: Ambulatory Visit | Attending: Orthopedic Surgery

## 2018-03-20 DIAGNOSIS — M1710 Unilateral primary osteoarthritis, unspecified knee: Secondary | ICD-10-CM

## 2018-03-20 DIAGNOSIS — Z6836 Body mass index (BMI) 36.0-36.9, adult: Secondary | ICD-10-CM | POA: Diagnosis not present

## 2018-03-20 DIAGNOSIS — I499 Cardiac arrhythmia, unspecified: Secondary | ICD-10-CM | POA: Diagnosis present

## 2018-03-20 DIAGNOSIS — I4891 Unspecified atrial fibrillation: Secondary | ICD-10-CM | POA: Diagnosis present

## 2018-03-20 DIAGNOSIS — M179 Osteoarthritis of knee, unspecified: Secondary | ICD-10-CM | POA: Diagnosis present

## 2018-03-20 DIAGNOSIS — Z7901 Long term (current) use of anticoagulants: Secondary | ICD-10-CM

## 2018-03-20 DIAGNOSIS — E7800 Pure hypercholesterolemia, unspecified: Secondary | ICD-10-CM | POA: Diagnosis present

## 2018-03-20 DIAGNOSIS — E669 Obesity, unspecified: Secondary | ICD-10-CM | POA: Diagnosis present

## 2018-03-20 DIAGNOSIS — Z79899 Other long term (current) drug therapy: Secondary | ICD-10-CM | POA: Diagnosis not present

## 2018-03-20 DIAGNOSIS — I10 Essential (primary) hypertension: Secondary | ICD-10-CM | POA: Diagnosis present

## 2018-03-20 DIAGNOSIS — Z85828 Personal history of other malignant neoplasm of skin: Secondary | ICD-10-CM

## 2018-03-20 DIAGNOSIS — M1711 Unilateral primary osteoarthritis, right knee: Principal | ICD-10-CM | POA: Diagnosis present

## 2018-03-20 HISTORY — PX: TOTAL KNEE ARTHROPLASTY: SHX125

## 2018-03-20 LAB — TYPE AND SCREEN
ABO/RH(D): O NEG
Antibody Screen: NEGATIVE

## 2018-03-20 SURGERY — ARTHROPLASTY, KNEE, TOTAL
Anesthesia: Spinal | Site: Knee | Laterality: Right

## 2018-03-20 MED ORDER — AMIODARONE HCL 200 MG PO TABS
200.0000 mg | ORAL_TABLET | Freq: Every day | ORAL | Status: DC
  Administered 2018-03-21 – 2018-03-22 (×2): 200 mg via ORAL
  Filled 2018-03-20 (×2): qty 1

## 2018-03-20 MED ORDER — METOCLOPRAMIDE HCL 5 MG PO TABS: 5.0000 mg | ORAL_TABLET | Freq: Three times a day (TID) | ORAL | Status: DC | PRN

## 2018-03-20 MED ORDER — METOCLOPRAMIDE HCL 5 MG/ML IJ SOLN: 5.0000 mg | Freq: Three times a day (TID) | INTRAMUSCULAR | Status: DC | PRN

## 2018-03-20 MED ORDER — SODIUM CHLORIDE 0.9 % IJ SOLN
INTRAMUSCULAR | Status: DC | PRN
  Administered 2018-03-20: 60 mL

## 2018-03-20 MED ORDER — SODIUM CHLORIDE 0.9 % IJ SOLN
INTRAMUSCULAR | Status: AC
  Filled 2018-03-20: qty 10

## 2018-03-20 MED ORDER — PROMETHAZINE HCL 25 MG/ML IJ SOLN: 6.2500 mg | INTRAMUSCULAR | Status: DC | PRN

## 2018-03-20 MED ORDER — METHOCARBAMOL 500 MG PO TABS: 500.0000 mg | ORAL_TABLET | Freq: Four times a day (QID) | ORAL | 0 refills | Status: AC | PRN

## 2018-03-20 MED ORDER — HYDROMORPHONE HCL 1 MG/ML IJ SOLN: 0.5000 mg | INTRAMUSCULAR | Status: DC | PRN

## 2018-03-20 MED ORDER — DIPHENHYDRAMINE HCL 12.5 MG/5ML PO ELIX: 12.5000 mg | ORAL_SOLUTION | ORAL | Status: DC | PRN

## 2018-03-20 MED ORDER — ONDANSETRON HCL 4 MG/2ML IJ SOLN: 4.0000 mg | Freq: Four times a day (QID) | INTRAMUSCULAR | Status: DC | PRN

## 2018-03-20 MED ORDER — PROPOFOL 10 MG/ML IV BOLUS
INTRAVENOUS | Status: AC
  Filled 2018-03-20: qty 40

## 2018-03-20 MED ORDER — MENTHOL 3 MG MT LOZG: 1.0000 | LOZENGE | OROMUCOSAL | Status: DC | PRN

## 2018-03-20 MED ORDER — EPHEDRINE 5 MG/ML INJ
INTRAVENOUS | Status: AC
  Filled 2018-03-20: qty 10

## 2018-03-20 MED ORDER — ACETAMINOPHEN 500 MG PO TABS
1000.0000 mg | ORAL_TABLET | Freq: Four times a day (QID) | ORAL | Status: AC
  Administered 2018-03-20 – 2018-03-21 (×3): 1000 mg via ORAL
  Filled 2018-03-20 (×3): qty 2

## 2018-03-20 MED ORDER — MIDAZOLAM HCL 5 MG/5ML IJ SOLN
INTRAMUSCULAR | Status: DC | PRN
  Administered 2018-03-20: 1 mg via INTRAVENOUS

## 2018-03-20 MED ORDER — DOCUSATE SODIUM 100 MG PO CAPS
100.0000 mg | ORAL_CAPSULE | Freq: Two times a day (BID) | ORAL | Status: DC
  Administered 2018-03-20 – 2018-03-22 (×4): 100 mg via ORAL
  Filled 2018-03-20 (×4): qty 1

## 2018-03-20 MED ORDER — SODIUM CHLORIDE 0.9 % IR SOLN
Status: DC | PRN
  Administered 2018-03-20: 1000 mL

## 2018-03-20 MED ORDER — PRAVASTATIN SODIUM 20 MG PO TABS
40.0000 mg | ORAL_TABLET | Freq: Every day | ORAL | Status: DC
  Administered 2018-03-20 – 2018-03-21 (×2): 40 mg via ORAL
  Filled 2018-03-20 (×2): qty 2

## 2018-03-20 MED ORDER — HYDROMORPHONE HCL 1 MG/ML IJ SOLN: 0.2500 mg | INTRAMUSCULAR | Status: DC | PRN

## 2018-03-20 MED ORDER — OXYCODONE HCL 5 MG PO TABS
5.0000 mg | ORAL_TABLET | ORAL | Status: DC | PRN
  Administered 2018-03-20: 10 mg via ORAL
  Administered 2018-03-20: 5 mg via ORAL
  Administered 2018-03-21 – 2018-03-22 (×3): 10 mg via ORAL
  Filled 2018-03-20: qty 2
  Filled 2018-03-20: qty 1
  Filled 2018-03-20 (×4): qty 2

## 2018-03-20 MED ORDER — MIDAZOLAM HCL 2 MG/2ML IJ SOLN
INTRAMUSCULAR | Status: AC
  Filled 2018-03-20: qty 2

## 2018-03-20 MED ORDER — FENTANYL CITRATE (PF) 100 MCG/2ML IJ SOLN
INTRAMUSCULAR | Status: AC
  Filled 2018-03-20: qty 2

## 2018-03-20 MED ORDER — CEFAZOLIN SODIUM-DEXTROSE 2-4 GM/100ML-% IV SOLN
2.0000 g | Freq: Four times a day (QID) | INTRAVENOUS | Status: AC
  Administered 2018-03-20 (×2): 2 g via INTRAVENOUS
  Filled 2018-03-20 (×2): qty 100

## 2018-03-20 MED ORDER — PHENOL 1.4 % MT LIQD: 1.0000 | OROMUCOSAL | Status: DC | PRN

## 2018-03-20 MED ORDER — LACTATED RINGERS IV SOLN: INTRAVENOUS | Status: DC

## 2018-03-20 MED ORDER — POLYETHYLENE GLYCOL 3350 17 G PO PACK: 17.0000 g | PACK | Freq: Every day | ORAL | Status: DC | PRN

## 2018-03-20 MED ORDER — EPHEDRINE SULFATE 50 MG/ML IJ SOLN
INTRAMUSCULAR | Status: DC | PRN
  Administered 2018-03-20 (×2): 10 mg via INTRAVENOUS

## 2018-03-20 MED ORDER — GABAPENTIN 300 MG PO CAPS
300.0000 mg | ORAL_CAPSULE | Freq: Three times a day (TID) | ORAL | Status: DC
  Administered 2018-03-20 – 2018-03-22 (×6): 300 mg via ORAL
  Filled 2018-03-20: qty 1
  Filled 2018-03-20 (×2): qty 3
  Filled 2018-03-20 (×3): qty 1

## 2018-03-20 MED ORDER — FENTANYL CITRATE (PF) 100 MCG/2ML IJ SOLN
INTRAMUSCULAR | Status: DC | PRN
  Administered 2018-03-20 (×2): 50 ug via INTRAVENOUS

## 2018-03-20 MED ORDER — CHLORHEXIDINE GLUCONATE 4 % EX LIQD: 60.0000 mL | Freq: Once | CUTANEOUS | Status: DC

## 2018-03-20 MED ORDER — BISACODYL 10 MG RE SUPP: 10.0000 mg | Freq: Every day | RECTAL | Status: DC | PRN

## 2018-03-20 MED ORDER — METHOCARBAMOL 500 MG PO TABS
500.0000 mg | ORAL_TABLET | Freq: Four times a day (QID) | ORAL | Status: DC | PRN
Start: 2018-03-20 — End: 2018-03-22
  Administered 2018-03-21 – 2018-03-22 (×4): 500 mg via ORAL
  Filled 2018-03-20 (×5): qty 1

## 2018-03-20 MED ORDER — TRANEXAMIC ACID 1000 MG/10ML IV SOLN
1000.0000 mg | Freq: Once | INTRAVENOUS | Status: AC
  Administered 2018-03-20: 1000 mg via INTRAVENOUS
  Filled 2018-03-20: qty 10

## 2018-03-20 MED ORDER — OXYCODONE HCL 5 MG PO TABS
10.0000 mg | ORAL_TABLET | ORAL | Status: DC | PRN
  Administered 2018-03-22: 10 mg via ORAL
  Filled 2018-03-20: qty 2

## 2018-03-20 MED ORDER — ONDANSETRON HCL 4 MG PO TABS: 4.0000 mg | ORAL_TABLET | Freq: Four times a day (QID) | ORAL | Status: DC | PRN

## 2018-03-20 MED ORDER — SODIUM CHLORIDE 0.9 % IJ SOLN
INTRAMUSCULAR | Status: AC
  Filled 2018-03-20: qty 50

## 2018-03-20 MED ORDER — BUPIVACAINE IN DEXTROSE 0.75-8.25 % IT SOLN
INTRATHECAL | Status: DC | PRN
  Administered 2018-03-20: 1.6 mL via INTRATHECAL

## 2018-03-20 MED ORDER — STERILE WATER FOR IRRIGATION IR SOLN
Status: DC | PRN
  Administered 2018-03-20: 2000 mL

## 2018-03-20 MED ORDER — METOPROLOL SUCCINATE ER 25 MG PO TB24
25.0000 mg | ORAL_TABLET | Freq: Every day | ORAL | Status: DC
  Administered 2018-03-21 – 2018-03-22 (×2): 25 mg via ORAL
  Filled 2018-03-20 (×2): qty 1

## 2018-03-20 MED ORDER — DEXAMETHASONE SODIUM PHOSPHATE 10 MG/ML IJ SOLN
10.0000 mg | Freq: Once | INTRAMUSCULAR | Status: AC
  Administered 2018-03-21: 10 mg via INTRAVENOUS
  Filled 2018-03-20: qty 1

## 2018-03-20 MED ORDER — METHOCARBAMOL 1000 MG/10ML IJ SOLN
500.0000 mg | Freq: Four times a day (QID) | INTRAVENOUS | Status: DC | PRN
  Filled 2018-03-20: qty 5

## 2018-03-20 MED ORDER — HYDROCHLOROTHIAZIDE 12.5 MG PO CAPS
12.5000 mg | ORAL_CAPSULE | Freq: Every day | ORAL | Status: DC
  Administered 2018-03-21 – 2018-03-22 (×2): 12.5 mg via ORAL
  Filled 2018-03-20 (×2): qty 1

## 2018-03-20 MED ORDER — ACETAMINOPHEN 10 MG/ML IV SOLN
1000.0000 mg | Freq: Once | INTRAVENOUS | Status: AC
  Administered 2018-03-20: 1000 mg via INTRAVENOUS
  Filled 2018-03-20: qty 100

## 2018-03-20 MED ORDER — CEFAZOLIN SODIUM-DEXTROSE 2-4 GM/100ML-% IV SOLN
2.0000 g | INTRAVENOUS | Status: AC
  Administered 2018-03-20: 2 g via INTRAVENOUS
  Filled 2018-03-20: qty 100

## 2018-03-20 MED ORDER — FLEET ENEMA 7-19 GM/118ML RE ENEM: 1.0000 | ENEMA | Freq: Once | RECTAL | Status: DC | PRN

## 2018-03-20 MED ORDER — TRAMADOL HCL 50 MG PO TABS: 50.0000 mg | ORAL_TABLET | Freq: Four times a day (QID) | ORAL | Status: DC | PRN

## 2018-03-20 MED ORDER — PROPOFOL 500 MG/50ML IV EMUL
INTRAVENOUS | Status: DC | PRN
  Administered 2018-03-20: 75 ug/kg/min via INTRAVENOUS

## 2018-03-20 MED ORDER — APIXABAN 2.5 MG PO TABS
2.5000 mg | ORAL_TABLET | Freq: Two times a day (BID) | ORAL | Status: DC
  Administered 2018-03-21 – 2018-03-22 (×3): 2.5 mg via ORAL
  Filled 2018-03-20 (×3): qty 1

## 2018-03-20 MED ORDER — DEXAMETHASONE SODIUM PHOSPHATE 10 MG/ML IJ SOLN
10.0000 mg | Freq: Once | INTRAMUSCULAR | Status: AC
  Administered 2018-03-20: 10 mg via INTRAVENOUS

## 2018-03-20 MED ORDER — SODIUM CHLORIDE 0.9 % IV SOLN
INTRAVENOUS | Status: DC
  Administered 2018-03-20: 17:00:00 via INTRAVENOUS

## 2018-03-20 MED ORDER — LACTATED RINGERS IV SOLN
INTRAVENOUS | Status: DC
  Administered 2018-03-20 (×2): via INTRAVENOUS

## 2018-03-20 MED ORDER — ONDANSETRON HCL 4 MG/2ML IJ SOLN
INTRAMUSCULAR | Status: DC | PRN
  Administered 2018-03-20: 4 mg via INTRAVENOUS

## 2018-03-20 MED ORDER — ROPIVACAINE HCL 7.5 MG/ML IJ SOLN
INTRAMUSCULAR | Status: DC | PRN
  Administered 2018-03-20 (×2): 20 mL via PERINEURAL

## 2018-03-20 MED ORDER — BUPIVACAINE LIPOSOME 1.3 % IJ SUSP
INTRAMUSCULAR | Status: DC | PRN
  Administered 2018-03-20: 20 mL

## 2018-03-20 SURGICAL SUPPLY — 60 items
BAG DECANTER FOR FLEXI CONT (MISCELLANEOUS) IMPLANT
BAG ZIPLOCK 12X15 (MISCELLANEOUS) ×3 IMPLANT
BANDAGE ACE 6X5 VEL STRL LF (GAUZE/BANDAGES/DRESSINGS) ×3 IMPLANT
BLADE SAG 18X100X1.27 (BLADE) ×3 IMPLANT
BLADE SAW SGTL 11.0X1.19X90.0M (BLADE) ×3 IMPLANT
BOWL SMART MIX CTS (DISPOSABLE) ×3 IMPLANT
CAP KNEE TOTAL 3 SIGMA ×3 IMPLANT
CEMENT HV SMART SET (Cement) ×6 IMPLANT
CLOSURE WOUND 1/2 X4 (GAUZE/BANDAGES/DRESSINGS) ×1
COVER SURGICAL LIGHT HANDLE (MISCELLANEOUS) ×3 IMPLANT
CUFF TOURN SGL QUICK 34 (TOURNIQUET CUFF) ×2
CUFF TRNQT CYL 34X4X40X1 (TOURNIQUET CUFF) ×1 IMPLANT
DECANTER SPIKE VIAL GLASS SM (MISCELLANEOUS) ×6 IMPLANT
DRAPE TOP 10253 STERILE (DRAPES) IMPLANT
DRAPE U-SHAPE 47X51 STRL (DRAPES) ×3 IMPLANT
DRSG ADAPTIC 3X8 NADH LF (GAUZE/BANDAGES/DRESSINGS) ×3 IMPLANT
DRSG PAD ABDOMINAL 8X10 ST (GAUZE/BANDAGES/DRESSINGS) ×3 IMPLANT
DURAPREP 26ML APPLICATOR (WOUND CARE) ×3 IMPLANT
ELECT REM PT RETURN 15FT ADLT (MISCELLANEOUS) ×3 IMPLANT
EVACUATOR 1/8 PVC DRAIN (DRAIN) ×3 IMPLANT
GAUZE SPONGE 4X4 12PLY STRL (GAUZE/BANDAGES/DRESSINGS) ×3 IMPLANT
GLOVE BIO SURGEON STRL SZ7.5 (GLOVE) IMPLANT
GLOVE BIO SURGEON STRL SZ8 (GLOVE) ×3 IMPLANT
GLOVE BIOGEL PI IND STRL 6.5 (GLOVE) IMPLANT
GLOVE BIOGEL PI IND STRL 7.0 (GLOVE) ×3 IMPLANT
GLOVE BIOGEL PI IND STRL 7.5 (GLOVE) ×2 IMPLANT
GLOVE BIOGEL PI IND STRL 8 (GLOVE) ×3 IMPLANT
GLOVE BIOGEL PI INDICATOR 6.5 (GLOVE)
GLOVE BIOGEL PI INDICATOR 7.0 (GLOVE) ×6
GLOVE BIOGEL PI INDICATOR 7.5 (GLOVE) ×4
GLOVE BIOGEL PI INDICATOR 8 (GLOVE) ×6
GLOVE SURG ORTHO 8.0 STRL STRW (GLOVE) ×3 IMPLANT
GLOVE SURG SS PI 6.5 STRL IVOR (GLOVE) IMPLANT
GLOVE SURG SS PI 8.0 STRL IVOR (GLOVE) ×3 IMPLANT
GOWN STRL REIN 3XL XLG LVL4 (GOWN DISPOSABLE) ×3 IMPLANT
GOWN STRL REUS W/TWL LRG LVL3 (GOWN DISPOSABLE) ×3 IMPLANT
GOWN STRL REUS W/TWL XL LVL3 (GOWN DISPOSABLE) ×9 IMPLANT
HANDPIECE INTERPULSE COAX TIP (DISPOSABLE) ×2
IMMOBILIZER KNEE 20 (SOFTGOODS) ×3
IMMOBILIZER KNEE 20 THIGH 36 (SOFTGOODS) ×1 IMPLANT
IMMOBILIZER KNEE 22 (SOFTGOODS) ×3 IMPLANT
MANIFOLD NEPTUNE II (INSTRUMENTS) ×3 IMPLANT
NS IRRIG 1000ML POUR BTL (IV SOLUTION) ×3 IMPLANT
PACK TOTAL KNEE CUSTOM (KITS) ×3 IMPLANT
PAD ABD 8X10 STRL (GAUZE/BANDAGES/DRESSINGS) ×3 IMPLANT
PADDING CAST COTTON 6X4 STRL (CAST SUPPLIES) ×3 IMPLANT
POSITIONER SURGICAL ARM (MISCELLANEOUS) ×3 IMPLANT
SET HNDPC FAN SPRY TIP SCT (DISPOSABLE) ×1 IMPLANT
STRIP CLOSURE SKIN 1/2X4 (GAUZE/BANDAGES/DRESSINGS) ×2 IMPLANT
SUT MNCRL AB 4-0 PS2 18 (SUTURE) ×3 IMPLANT
SUT STRATAFIX 0 PDS 27 VIOLET (SUTURE) ×3
SUT VIC AB 2-0 CT1 27 (SUTURE) ×10
SUT VIC AB 2-0 CT1 TAPERPNT 27 (SUTURE) ×5 IMPLANT
SUTURE STRATFX 0 PDS 27 VIOLET (SUTURE) ×1 IMPLANT
SYR 50ML LL SCALE MARK (SYRINGE) IMPLANT
TRAY FOLEY CATH 14FRSI W/METER (CATHETERS) ×3 IMPLANT
TRAY FOLEY W/METER SILVER 16FR (SET/KITS/TRAYS/PACK) IMPLANT
WATER STERILE IRR 1000ML POUR (IV SOLUTION) ×6 IMPLANT
WRAP KNEE MAXI GEL POST OP (GAUZE/BANDAGES/DRESSINGS) ×3 IMPLANT
YANKAUER SUCT BULB TIP 10FT TU (MISCELLANEOUS) ×3 IMPLANT

## 2018-03-20 NOTE — Anesthesia Procedure Notes (Signed)
Date/Time: 03/20/2018 7:08 AM Performed by: Glory Buff, CRNA Oxygen Delivery Method: Simple face mask

## 2018-03-20 NOTE — Anesthesia Procedure Notes (Signed)
Anesthesia Procedure Image    

## 2018-03-20 NOTE — Anesthesia Procedure Notes (Signed)
Spinal  Patient location during procedure: OR Start time: 03/20/2018 7:10 AM End time: 03/20/2018 7:16 AM Staffing Anesthesiologist: Myrtie Soman, MD Performed: anesthesiologist  Preanesthetic Checklist Completed: patient identified, site marked, surgical consent, pre-op evaluation, timeout performed, IV checked, risks and benefits discussed and monitors and equipment checked Spinal Block Patient position: sitting Prep: Betadine Patient monitoring: heart rate, continuous pulse ox and blood pressure Location: L4-5 Injection technique: single-shot Needle Needle type: Sprotte  Needle gauge: 24 G Needle length: 9 cm Additional Notes Expiration date of kit checked and confirmed. Patient tolerated procedure well, without complications.

## 2018-03-20 NOTE — Evaluation (Signed)
Physical Therapy Evaluation Patient Details Name: Lisa Sims MRN: [id number] DOB: May 09, 1941 Today's Date: 03/20/2018   History of Present Illness  s/p R TKA  Clinical Impression  Pt is s/p TKA resulting in the deficits listed below (see PT Problem List).  amb 5' with RW and min assist, should progress well with PT  Pt will benefit from skilled PT to increase their independence and safety with mobility to allow discharge to the venue listed below.      Follow Up Recommendations Follow surgeon's recommendation for DC plan and follow-up therapies    Equipment Recommendations  None recommended by PT    Recommendations for Other Services       Precautions / Restrictions Precautions Precautions: Knee;Fall Precaution Comments: able to perform SLR but with quad lag, KI placed for safety Required Braces or Orthoses: Knee Immobilizer - Right Knee Immobilizer - Right: Discontinue once straight leg raise with < 10 degree lag Restrictions Weight Bearing Restrictions: No Other Position/Activity Restrictions: WBAT      Mobility  Bed Mobility Overal bed mobility: Needs Assistance Bed Mobility: Supine to Sit     Supine to sit: Min assist     General bed mobility comments: assist with RLE  Transfers Overall transfer level: Needs assistance Equipment used: Rolling walker (2 wheeled) Transfers: Sit to/from Stand Sit to Stand: Min assist         General transfer comment: assist to rise and stabilize, cues for hand placement  Ambulation/Gait Ambulation/Gait assistance: Min assist Ambulation Distance (Feet): 80 Feet Assistive device: Rolling walker (2 wheeled) Gait Pattern/deviations: Step-to pattern     General Gait Details: cues for sequence and RW position  Stairs            Wheelchair Mobility    Modified Rankin (Stroke Patients Only)       Balance                                             Pertinent Vitals/Pain Pain  Assessment: 0-10 Pain Score: 4  Pain Location: right knee Pain Descriptors / Indicators: Sore;Grimacing Pain Intervention(s): Monitored during session;Limited activity within patient's tolerance;Premedicated before session    Home Living Family/patient expects to be discharged to:: Private residence Living Arrangements: Alone Available Help at Discharge: Family Type of Home: House Home Access: Ramped entrance     Home Layout: One level Home Equipment: Environmental consultant - 2 wheels;Bedside commode;Shower seat Additional Comments: DME belonged to her husband    Prior Function Level of Independence: Independent               Hand Dominance        Extremity/Trunk Assessment   Upper Extremity Assessment Upper Extremity Assessment: Overall WFL for tasks assessed    Lower Extremity Assessment Lower Extremity Assessment: RLE deficits/detail RLE Deficits / Details: ankle WFL; knee extension and hip flexion 3/5; knee flexion ~ 6* to 50*       Communication   Communication: No difficulties  Cognition Arousal/Alertness: Awake/alert Behavior During Therapy: WFL for tasks assessed/performed Overall Cognitive Status: Within Functional Limits for tasks assessed                                        General Comments      Exercises Total Joint  Exercises Ankle Circles/Pumps: AROM;Both;10 reps Quad Sets: Both;AROM;5 reps   Assessment/Plan    PT Assessment Patient needs continued PT services  PT Problem List Decreased strength;Decreased range of motion;Decreased activity tolerance;Decreased mobility;Pain;Decreased knowledge of use of DME       PT Treatment Interventions DME instruction;Gait training;Therapeutic activities;Functional mobility training;Therapeutic exercise;Patient/family education    PT Goals (Current goals can be found in the Care Plan section)  Acute Rehab PT Goals Patient Stated Goal: home, back to doing her own yard work PT Goal Formulation:  With patient Time For Goal Achievement: 03/27/18 Potential to Achieve Goals: Good    Frequency 7X/week   Barriers to discharge        Co-evaluation               AM-PAC PT "6 Clicks" Daily Activity  Outcome Measure Difficulty turning over in bed (including adjusting bedclothes, sheets and blankets)?: Unable Difficulty moving from lying on back to sitting on the side of the bed? : Unable Difficulty sitting down on and standing up from a chair with arms (e.g., wheelchair, bedside commode, etc,.)?: Unable Help needed moving to and from a bed to chair (including a wheelchair)?: A Little Help needed walking in hospital room?: A Little Help needed climbing 3-5 steps with a railing? : A Lot 6 Click Score: 11    End of Session Equipment Utilized During Treatment: Gait belt Activity Tolerance: Patient tolerated treatment well Patient left: in chair;with call bell/phone within reach;with family/visitor present;with chair alarm set   PT Visit Diagnosis: Difficulty in walking, not elsewhere classified (R26.2)    Time: 9357-0177 PT Time Calculation (min) (ACUTE ONLY): 23 min   Charges:   PT Evaluation $PT Eval Low Complexity: 1 Low PT Treatments $Gait Training: 8-22 mins   PT G CodesKenyon Ana, PT Pager: [phone] 03/20/2018   Erlanger Bledsoe 03/20/2018, 4:15 PM

## 2018-03-20 NOTE — Discharge Instructions (Addendum)

## 2018-03-20 NOTE — Op Note (Signed)
OPERATIVE REPORT-TOTAL KNEE ARTHROPLASTY   Pre-operative diagnosis- Osteoarthritis  Right knee(s)  Post-operative diagnosis- Osteoarthritis Right knee(s)  Procedure-  Right  Total Knee Arthroplasty  Surgeon- Lisa Plover. Demico Ploch, MD  Assistant- Molli Barrows, PA-C   Anesthesia-  Adductor canal block and spinal  EBL-25 ml   Drains Hemovac  Tourniquet time-  Total Tourniquet Time Documented: Thigh (Right) - 39 minutes Total: Thigh (Right) - 39 minutes     Complications- None  Condition-PACU - hemodynamically stable.   Brief Clinical Note  Lisa Sims is a 77 y.o. year old female with end stage OA of her right knee with progressively worsening pain and dysfunction. She has constant pain, with activity and at rest and significant functional deficits with difficulties even with ADLs. She has had extensive non-op management including analgesics, injections of cortisone and viscosupplements, and home exercise program, but remains in significant pain with significant dysfunction.Radiographs show bone on bone arthritis medial and patellofemoral. She presents now for right Total Knee Arthroplasty.    Procedure in detail---   The patient is brought into the operating room and positioned supine on the operating table. After successful administration of  Adductor canal block and spinal,   a tourniquet is placed high on the  Right thigh(s) and the lower extremity is prepped and draped in the usual sterile fashion. Time out is performed by the operating team and then the  Right lower extremity is wrapped in Esmarch, knee flexed and the tourniquet inflated to 300 mmHg.       A midline incision is made with a ten blade through the subcutaneous tissue to the level of the extensor mechanism. A fresh blade is used to make a medial parapatellar arthrotomy. Soft tissue over the proximal medial tibia is subperiosteally elevated to the joint line with a knife and into the semimembranosus bursa with a  Cobb elevator. Soft tissue over the proximal lateral tibia is elevated with attention being paid to avoiding the patellar tendon on the tibial tubercle. The patella is everted, knee flexed 90 degrees and the ACL and PCL are removed. Findings are bone on bone medial and patellofemoral with large global osteophytes.        The drill is used to create a starting hole in the distal femur and the canal is thoroughly irrigated with sterile saline to remove the fatty contents. The 5 degree Right  valgus alignment guide is placed into the femoral canal and the distal femoral cutting block is pinned to remove 9 mm off the distal femur. Resection is made with an oscillating saw.      The tibia is subluxed forward and the menisci are removed. The extramedullary alignment guide is placed referencing proximally at the medial aspect of the tibial tubercle and distally along the second metatarsal axis and tibial crest. The block is pinned to remove 69mm off the more deficient medial  side. Resection is made with an oscillating saw. Size 3is the most appropriate size for the tibia and the proximal tibia is prepared with the modular drill and keel punch for that size.      The femoral sizing guide is placed and size 3 is most appropriate. Rotation is marked off the epicondylar axis and confirmed by creating a rectangular flexion gap at 90 degrees. The size 3 cutting block is pinned in this rotation and the anterior, posterior and chamfer cuts are made with the oscillating saw. The intercondylar block is then placed and that cut is made.  Trial size 3 tibial component, trial size 3 posterior stabilized femur and a 15  mm posterior stabilized rotating platform insert trial is placed. Full extension is achieved with excellent varus/valgus and anterior/posterior balance throughout full range of motion. The patella is everted and thickness measured to be 22  mm. Free hand resection is taken to 12 mm, a 38 template is placed, lug  holes are drilled, trial patella is placed, and it tracks normally. Osteophytes are removed off the posterior femur with the trial in place. All trials are removed and the cut bone surfaces prepared with pulsatile lavage. Cement is mixed and once ready for implantation, the size 3 tibial implant, size  3 posterior stabilized femoral component, and the size 38 patella are cemented in place and the patella is held with the clamp. The trial insert is placed and the knee held in full extension. The Exparel (20 ml mixed with 60 ml saline) is injected into the extensor mechanism, posterior capsule, medial and lateral gutters and subcutaneous tissues.  All extruded cement is removed and once the cement is hard the permanent 15 mm posterior stabilized rotating platform insert is placed into the tibial tray.      The wound is copiously irrigated with saline solution and the extensor mechanism closed over a hemovac drain with #1 V-loc suture. The tourniquet is released for a total tourniquet time of 39  minutes. Flexion against gravity is 130 degrees and the patella tracks normally. Subcutaneous tissue is closed with 2.0 vicryl and subcuticular with running 4.0 Monocryl. The incision is cleaned and dried and steri-strips and a bulky sterile dressing are applied. The limb is placed into a knee immobilizer and the patient is awakened and transported to recovery in stable condition.      Please note that a surgical assistant was a medical necessity for this procedure in order to perform it in a safe and expeditious manner. Surgical assistant was necessary to retract the ligaments and vital neurovascular structures to prevent injury to them and also necessary for proper positioning of the limb to allow for anatomic placement of the prosthesis.   Lisa Plover Shavanna Furnari, MD    03/20/2018, 8:24 AM

## 2018-03-20 NOTE — Anesthesia Procedure Notes (Signed)
Anesthesia Regional Block: Adductor canal block   Pre-Anesthetic Checklist: ,, timeout performed, Correct Patient, Correct Site, Correct Laterality, Correct Procedure, Correct Position, site marked, Risks and benefits discussed,  Surgical consent,  Pre-op evaluation,  At surgeon's request and post-op pain management  Laterality: Right  Prep: chloraprep       Needles:  Injection technique: Single-shot  Needle Type: Echogenic Needle     Needle Length: 9cm      Additional Needles:   Procedures:,,,, ultrasound used (permanent image in chart),,,,  Narrative:  Start time: 03/20/2018 6:55 AM End time: 03/20/2018 7:04 AM Injection made incrementally with aspirations every 5 mL.  Performed by: Personally  Anesthesiologist: Myrtie Soman, MD  Additional Notes: Patient tolerated the procedure well without complications

## 2018-03-20 NOTE — Anesthesia Preprocedure Evaluation (Addendum)
Anesthesia Evaluation  Patient identified by MRN, date of birth, ID band Patient awake    Reviewed: Allergy & Precautions, NPO status , Patient's Chart, lab work & pertinent test results  Airway Mallampati: II  TM Distance: >3 FB Neck ROM: Full    Dental no notable dental hx.    Pulmonary neg pulmonary ROS,    Pulmonary exam normal breath sounds clear to auscultation       Cardiovascular hypertension, Normal cardiovascular exam Rhythm:Regular Rate:Normal     Neuro/Psych negative neurological ROS  negative psych ROS   GI/Hepatic negative GI ROS, Neg liver ROS,   Endo/Other  negative endocrine ROS  Renal/GU negative Renal ROS  negative genitourinary   Musculoskeletal negative musculoskeletal ROS (+)   Abdominal   Peds negative pediatric ROS (+)  Hematology negative hematology ROS (+)   Anesthesia Other Findings   Reproductive/Obstetrics negative OB ROS                             Anesthesia Physical Anesthesia Plan  ASA: III  Anesthesia Plan: Spinal   Post-op Pain Management:    Induction: Intravenous  PONV Risk Score and Plan: 2  Airway Management Planned: Simple Face Mask  Additional Equipment:   Intra-op Plan:   Post-operative Plan:   Informed Consent: I have reviewed the patients History and Physical, chart, labs and discussed the procedure including the risks, benefits and alternatives for the proposed anesthesia with the patient or authorized representative who has indicated his/her understanding and acceptance.   Dental advisory given  Plan Discussed with: CRNA and Surgeon  Anesthesia Plan Comments:         Anesthesia Quick Evaluation

## 2018-03-20 NOTE — Anesthesia Postprocedure Evaluation (Signed)
Anesthesia Post Note  Patient: Lisa Sims  Procedure(s) Performed: RIGHT TOTAL KNEE ARTHROPLASTY (Right Knee)     Patient location during evaluation: PACU Anesthesia Type: Spinal Level of consciousness: oriented and awake and alert Pain management: pain level controlled Vital Signs Assessment: post-procedure vital signs reviewed and stable Respiratory status: spontaneous breathing, respiratory function stable and patient connected to nasal cannula oxygen Cardiovascular status: blood pressure returned to baseline and stable Postop Assessment: no headache, no backache and no apparent nausea or vomiting Anesthetic complications: no    Last Vitals:  Vitals:   03/20/18 0945 03/20/18 1000  BP: (!) 159/66 (!) 167/61  Pulse: (!) 43 (!) 45  Resp: 17 17  Temp:  36.4 C  SpO2: 99% 99%    Last Pain:  Vitals:   03/20/18 1000  TempSrc:   PainSc: 0-No pain    LLE Motor Response: Purposeful movement (03/20/18 1000) LLE Sensation: Decreased (03/20/18 1000) RLE Motor Response: Purposeful movement (03/20/18 1000) RLE Sensation: Decreased (03/20/18 1000) L Sensory Level: L3-Anterior knee, lower leg (03/20/18 1000) R Sensory Level: L3-Anterior knee, lower leg (03/20/18 1000)  Deysha Cartier S

## 2018-03-20 NOTE — Transfer of Care (Signed)
Immediate Anesthesia Transfer of Care Note  Patient: Lisa Sims  Procedure(s) Performed: RIGHT TOTAL KNEE ARTHROPLASTY (Right Knee)  Patient Location: PACU  Anesthesia Type:MAC and Spinal  Level of Consciousness: awake, alert  and oriented  Airway & Oxygen Therapy: Patient Spontanous Breathing and Patient connected to face mask oxygen  Post-op Assessment: Report given to RN and Post -op Vital signs reviewed and stable  Post vital signs: Reviewed and stable  Last Vitals:  Vitals Value Taken Time  BP    Temp    Pulse 49 03/20/2018  8:56 AM  Resp 17 03/20/2018  8:56 AM  SpO2 99 % 03/20/2018  8:56 AM  Vitals shown include unvalidated device data.  Last Pain:  Vitals:   03/20/18 0551  TempSrc:   PainSc: 0-No pain      Patients Stated Pain Goal: 4 (29/56/21 3086)  Complications: No apparent anesthesia complications

## 2018-03-20 NOTE — Plan of Care (Signed)
Reviewed plan of care, specifically pain control and safety measures. Pt attentive and verbalized understanding of all education.

## 2018-03-20 NOTE — Interval H&P Note (Signed)
History and Physical Interval Note:  03/20/2018 6:34 AM  Solon Augusta  has presented today for surgery, with the diagnosis of Osteoarthritis Right Knee  The various methods of treatment have been discussed with the patient and family. After consideration of risks, benefits and other options for treatment, the patient has consented to  Procedure(s): RIGHT TOTAL KNEE ARTHROPLASTY (Right) as a surgical intervention .  The patient's history has been reviewed, patient examined, no change in status, stable for surgery.  I have reviewed the patient's chart and labs.  Questions were answered to the patient's satisfaction.     Pilar Plate Josephene Marrone

## 2018-03-21 LAB — BASIC METABOLIC PANEL
Anion gap: 8 (ref 5–15)
BUN: 22 mg/dL — AB (ref 6–20)
CHLORIDE: 107 mmol/L (ref 101–111)
CO2: 23 mmol/L (ref 22–32)
Calcium: 8.6 mg/dL — ABNORMAL LOW (ref 8.9–10.3)
Creatinine, Ser: 1.1 mg/dL — ABNORMAL HIGH (ref 0.44–1.00)
GFR calc Af Amer: 54 mL/min — ABNORMAL LOW (ref >60)
GFR calc non Af Amer: 46 mL/min — ABNORMAL LOW (ref >60)
GLUCOSE: 137 mg/dL — AB (ref 65–99)
POTASSIUM: 4.7 mmol/L (ref 3.5–5.1)
Sodium: 138 mmol/L (ref 135–145)

## 2018-03-21 LAB — CBC
HEMATOCRIT: 33 % — AB (ref 36.0–46.0)
HEMOGLOBIN: 10.5 g/dL — AB (ref 12.0–15.0)
MCH: 29.7 pg (ref 26.0–34.0)
MCHC: 31.8 g/dL (ref 30.0–36.0)
MCV: 93.2 fL (ref 78.0–100.0)
Platelets: 203 10*3/uL (ref 150–400)
RBC: 3.54 MIL/uL — AB (ref 3.87–5.11)
RDW: 13.8 % (ref 11.5–15.5)
WBC: 10.1 10*3/uL (ref 4.0–10.5)

## 2018-03-21 NOTE — Progress Notes (Signed)
Spoke with patient at bedside. Confirmed plan for OP PT, already arranged. Needs a RW, contacted [facility] to deliver to room. [phone]

## 2018-03-21 NOTE — Progress Notes (Signed)
Physical Therapy Treatment Patient Details Name: Lisa Sims MRN: [id number] DOB: 02-18-1941 Today's Date: 03/21/2018    History of Present Illness s/p R TKA    PT Comments    Pt progressing well; thigh very sore d/t tourniquet--pain improved with mobility, hot packs to thigh; will see again in pm  Follow Up Recommendations  Follow surgeon's recommendation for DC plan and follow-up therapies     Equipment Recommendations  Rolling walker with 5" wheels    Recommendations for Other Services       Precautions / Restrictions Precautions Precautions: Knee;Fall Precaution Comments: able to perform SLR, KI not utilized Knee Immobilizer - Right: Discontinue once straight leg raise with < 10 degree lag Restrictions Weight Bearing Restrictions: No Other Position/Activity Restrictions: WBAT    Mobility  Bed Mobility               General bed mobility comments: in chair  Transfers Overall transfer level: Needs assistance Equipment used: Rolling walker (2 wheeled) Transfers: Sit to/from Stand Sit to Stand: Min guard         General transfer comment: cues for hand placement  Ambulation/Gait Ambulation/Gait assistance: Min guard Ambulation Distance (Feet): 120 Feet Assistive device: Rolling walker (2 wheeled) Gait Pattern/deviations: Step-to pattern;Step-through pattern;Decreased weight shift to right     General Gait Details: cues for sequence and RW position   Stairs             Wheelchair Mobility    Modified Rankin (Stroke Patients Only)       Balance                                            Cognition Arousal/Alertness: Awake/alert Behavior During Therapy: WFL for tasks assessed/performed Overall Cognitive Status: Within Functional Limits for tasks assessed                                        Exercises Total Joint Exercises Ankle Circles/Pumps: AROM;Both;10 reps Quad Sets: AROM;Both;10  reps Heel Slides: AAROM;Right;10 reps Hip ABduction/ADduction: AROM;Right;10 reps Straight Leg Raises: AAROM;AROM;10 reps Goniometric ROM: 5* to 70* right knee flexion    General Comments        Pertinent Vitals/Pain Pain Location: right knee and thigh Pain Descriptors / Indicators: Sore;Grimacing Pain Intervention(s): Limited activity within patient's tolerance;Monitored during session;Premedicated before session;Ice applied;Heat applied(ice to knee, heat to thigh)    Home Living                      Prior Function            PT Goals (current goals can now be found in the care plan section) Acute Rehab PT Goals Patient Stated Goal: home, back to doing her own yard work PT Goal Formulation: With patient Time For Goal Achievement: 03/27/18 Potential to Achieve Goals: Good Progress towards PT goals: Progressing toward goals    Frequency    7X/week      PT Plan Current plan remains appropriate    Co-evaluation              AM-PAC PT "6 Clicks" Daily Activity  Outcome Measure  Difficulty turning over in bed (including adjusting bedclothes, sheets and blankets)?: A Little Difficulty moving from lying on back to  sitting on the side of the bed? : Unable Difficulty sitting down on and standing up from a chair with arms (e.g., wheelchair, bedside commode, etc,.)?: A Little Help needed moving to and from a bed to chair (including a wheelchair)?: A Little Help needed walking in hospital room?: A Little Help needed climbing 3-5 steps with a railing? : A Lot 6 Click Score: 15    End of Session Equipment Utilized During Treatment: Gait belt Activity Tolerance: Patient tolerated treatment well Patient left: in chair;with call bell/phone within reach;with family/visitor present;with chair alarm set   PT Visit Diagnosis: Difficulty in walking, not elsewhere classified (R26.2)     Time: 7846-9629 PT Time Calculation (min) (ACUTE ONLY): 36 min  Charges:   $Gait Training: 8-22 mins $Therapeutic Exercise: 8-22 mins                    G CodesKenyon Ana, PT Pager: [phone] 03/21/2018    Kenyon Ana 03/21/2018, 11:29 AM

## 2018-03-21 NOTE — Progress Notes (Signed)
   03/21/18 1500  PT Visit Information  Assistance Needed +1  History of Present Illness s/p R TKA  Subjective Data  Subjective I  feel ok, thigh is hurting  Patient Stated Goal home, back to doing her own yard work  Precautions  Precautions Knee;Fall  Precaution Comments able to perform SLR, KI not utilized  Knee Immobilizer - Right Discontinue once straight leg raise with < 10 degree lag  Restrictions  Weight Bearing Restrictions No  Other Position/Activity Restrictions WBAT  Pain Assessment  Pain Assessment 0-10  Pain Score 5  Pain Location right knee and thigh  Pain Descriptors / Indicators Sore;Grimacing  Pain Intervention(s) Limited activity within patient's tolerance;Monitored during session;Premedicated before session;Repositioned  Cognition  Arousal/Alertness Awake/alert  Behavior During Therapy WFL for tasks assessed/performed  Overall Cognitive Status Within Functional Limits for tasks assessed  Bed Mobility  Overal bed mobility Needs Assistance  Bed Mobility Sit to Supine  Sit to supine Min assist  General bed mobility comments assist with RLE  Transfers  Overall transfer level Needs assistance  Equipment used Rolling walker (2 wheeled)  Transfers Sit to/from Stand  Sit to Stand Min guard;Supervision  General transfer comment cues for hand placement  Ambulation/Gait  Ambulation/Gait assistance Min guard  Ambulation Distance (Feet) 75 Feet (10' more)  Assistive device Rolling walker (2 wheeled)  Gait Pattern/deviations Step-to pattern;Step-through pattern;Decreased weight shift to right;Antalgic  General Gait Details cues for sequence and RW position  PT - End of Session  Equipment Utilized During Treatment Gait belt  Activity Tolerance Patient tolerated treatment well  Patient left with family/visitor present;in bed;with call bell/phone within reach;with bed alarm set   PT - Assessment/Plan  PT Plan Current plan remains appropriate  PT Visit Diagnosis  Difficulty in walking, not elsewhere classified (R26.2)  PT Frequency (ACUTE ONLY) 7X/week  Follow Up Recommendations Follow surgeon's recommendation for DC plan and follow-up therapies  PT equipment Rolling walker with 5" wheels  AM-PAC PT "6 Clicks" Daily Activity Outcome Measure  Difficulty turning over in bed (including adjusting bedclothes, sheets and blankets)? 3  Difficulty moving from lying on back to sitting on the side of the bed?  1  Difficulty sitting down on and standing up from a chair with arms (e.g., wheelchair, bedside commode, etc,.)? 3  Help needed moving to and from a bed to chair (including a wheelchair)? 3  Help needed walking in hospital room? 3  Help needed climbing 3-5 steps with a railing?  2  6 Click Score 15  Mobility G Code  CK  PT Goal Progression  Progress towards PT goals Progressing toward goals  Acute Rehab PT Goals  PT Goal Formulation With patient  Time For Goal Achievement 03/27/18  Potential to Achieve Goals Good  PT Time Calculation  PT Start Time (ACUTE ONLY) 1425  PT Stop Time (ACUTE ONLY) 1445  PT Time Calculation (min) (ACUTE ONLY) 20 min  PT General Charges  $$ ACUTE PT VISIT 1 Visit  PT Treatments  $Gait Training 8-22 mins

## 2018-03-21 NOTE — Progress Notes (Signed)
   Subjective: 1 Day Post-Op Procedure(s) (LRB): RIGHT TOTAL KNEE ARTHROPLASTY (Right) Patient reports pain as mild.   Patient seen in rounds with Dr. Wynelle Link. Patient is well, and has had no acute complaints or problems other than some discomfort in her right thigh from the tourniquet. Reports that she walked with therapy yesterday.  Plan is to go Home after hospital stay.  Objective: Vital signs in last 24 hours: Temp:  [96.3 F (35.7 C)-97.7 F (36.5 C)] 97.4 F (36.3 C) (04/30 0153) Pulse Rate:  [43-60] 50 (04/30 0153) Resp:  [14-20] 16 (04/29 1815) BP: (124-185)/(54-73) 156/69 (04/30 0153) SpO2:  [97 %-100 %] 98 % (04/30 0153)  Intake/Output from previous day:  Intake/Output Summary (Last 24 hours) at 03/21/2018 8338 Last data filed at 03/21/2018 0200 Gross per 24 hour  Intake 3300 ml  Output 2275 ml  Net 1025 ml    Intake/Output this shift: Total I/O In: 940 [P.O.:340; I.V.:600] Out: 600 [Urine:600]  EXAM General - Patient is Alert and Oriented Extremity - Neurologically intact Intact pulses distally Dorsiflexion/Plantar flexion intact Compartment soft Dressing - dressing C/D/I Motor Function - intact, moving foot and toes well on exam.  Hemovac pulled without difficulty.  Past Medical History:  Diagnosis Date  . Arthritis   . 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 Dysrhythmia   . Hypertension     Assessment/Plan: 1 Day Post-Op Procedure(s) (LRB): RIGHT TOTAL KNEE ARTHROPLASTY (Right) Principal Problem:   OA (osteoarthritis) of knee  Estimated body mass index is 36.48 kg/m as calculated from the following:   Height as of this encounter: 5\' 6"  (1.676 m).   Weight as of this encounter: 102.5 kg (226 lb). Advance diet Up with therapy D/C IV fluids when tolerating POs well  DVT Prophylaxis - Eliquis Weight-Bearing as tolerated  D/C O2 and Pulse OX and try on Room Air  Will continue working with therapy today. Can use heating pad over right thigh to help  with muscle soreness from tourniquet. Plan for DC home tomorrow with outpatient therapy at [facility].  Ardeen Jourdain, PA-C Orthopaedic Surgery 03/21/2018, 6:33 AM

## 2018-03-22 LAB — CBC
HEMATOCRIT: 31 % — AB (ref 36.0–46.0)
Hemoglobin: 9.9 g/dL — ABNORMAL LOW (ref 12.0–15.0)
MCH: 29.9 pg (ref 26.0–34.0)
MCHC: 31.9 g/dL (ref 30.0–36.0)
MCV: 93.7 fL (ref 78.0–100.0)
Platelets: 214 10*3/uL (ref 150–400)
RBC: 3.31 MIL/uL — ABNORMAL LOW (ref 3.87–5.11)
RDW: 14.2 % (ref 11.5–15.5)
WBC: 11.9 10*3/uL — ABNORMAL HIGH (ref 4.0–10.5)

## 2018-03-22 LAB — BASIC METABOLIC PANEL
Anion gap: 7 (ref 5–15)
BUN: 31 mg/dL — ABNORMAL HIGH (ref 6–20)
CALCIUM: 8.8 mg/dL — AB (ref 8.9–10.3)
CO2: 24 mmol/L (ref 22–32)
Chloride: 107 mmol/L (ref 101–111)
Creatinine, Ser: 1.31 mg/dL — ABNORMAL HIGH (ref 0.44–1.00)
GFR calc Af Amer: 44 mL/min — ABNORMAL LOW (ref >60)
GFR calc non Af Amer: 38 mL/min — ABNORMAL LOW (ref >60)
GLUCOSE: 123 mg/dL — AB (ref 65–99)
POTASSIUM: 4.9 mmol/L (ref 3.5–5.1)
Sodium: 138 mmol/L (ref 135–145)

## 2018-03-22 MED ORDER — OXYCODONE HCL 5 MG PO TABS: 5.0000 mg | ORAL_TABLET | Freq: Four times a day (QID) | ORAL | 0 refills | Status: AC | PRN

## 2018-03-22 MED ORDER — TRAMADOL HCL 50 MG PO TABS
50.0000 mg | ORAL_TABLET | Freq: Four times a day (QID) | ORAL | 0 refills | Status: AC | PRN
Start: 2018-03-22 — End: ?

## 2018-03-22 NOTE — Progress Notes (Signed)
Physical Therapy Treatment Patient Details Name: Lisa Sims MRN: [id number] DOB: Aug 02, 1941 Today's Date: 03/22/2018    History of Present Illness s/p R TKA    PT Comments    The patient is progressing well. Ready for Dc.   Follow Up Recommendations  Follow surgeon's recommendation for DC plan and follow-up therapies     Equipment Recommendations  Rolling walker with 5" wheels    Recommendations for Other Services       Precautions / Restrictions Precautions Precautions: Knee;Fall Precaution Comments: able to perform SLR, KI not utilized    Mobility  Bed Mobility         Supine to sit: Min assist     General bed mobility comments: assist with RLE  Transfers   Equipment used: Rolling walker (2 wheeled) Transfers: Sit to/from Stand Sit to Stand: Min [employer] transfer comment: cues for hand placement  Ambulation/Gait Ambulation/Gait assistance: Min guard Ambulation Distance (Feet): 120 Feet Assistive device: Rolling walker (2 wheeled) Gait Pattern/deviations: Step-to pattern;Step-through pattern;Decreased weight shift to right;Antalgic     General Gait Details: cues for sequence and RW position   Stairs             Wheelchair Mobility    Modified Rankin (Stroke Patients Only)       Balance                                            Cognition Arousal/Alertness: Awake/alert                                            Exercises Total Joint Exercises Ankle Circles/Pumps: AROM;Both;10 reps Quad Sets: AROM;Both;10 reps Short Arc Quad: AROM;Right;10 reps Heel Slides: AAROM;Right;10 reps Hip ABduction/ADduction: AROM;Right;10 reps Straight Leg Raises: AAROM;AROM;10 reps;Right Goniometric ROM: 5-70 right knee flexion    General Comments        Pertinent Vitals/Pain Pain Score: 3  Pain Location: right knee and thigh Pain Descriptors / Indicators:  Sore;Grimacing Pain Intervention(s): Monitored during session;Premedicated before session;Ice applied    Home Living                      Prior Function            PT Goals (current goals can now be found in the care plan section) Progress towards PT goals: Progressing toward goals    Frequency    7X/week      PT Plan Current plan remains appropriate    Co-evaluation              AM-PAC PT "6 Clicks" Daily Activity  Outcome Measure  Difficulty turning over in bed (including adjusting bedclothes, sheets and blankets)?: A Little Difficulty moving from lying on back to sitting on the side of the bed? : A Little Difficulty sitting down on and standing up from a chair with arms (e.g., wheelchair, bedside commode, etc,.)?: A Little Help needed moving to and from a bed to chair (including a wheelchair)?: A Little Help needed walking in hospital room?: A Little Help needed climbing 3-5 steps with a railing? : A Lot 6 Click Score: 17    End of Session   Activity Tolerance: Patient  tolerated treatment well Patient left: in chair;with call bell/phone within reach Nurse Communication: Mobility status(DC ready) PT Visit Diagnosis: Difficulty in walking, not elsewhere classified (R26.2)     Time: 2244-9753 PT Time Calculation (min) (ACUTE ONLY): 38 min  Charges:  $Gait Training: 8-22 mins $Therapeutic Exercise: 8-22 mins $Self Care/Home Management: 8-22                    G Codes:         Claretha Cooper 03/22/2018, 9:58 AM Tresa Endo PT [phone]

## 2018-03-22 NOTE — Discharge Summary (Signed)
Physician Discharge Summary   Patient ID: Lisa Sims MRN: [id number] DOB/AGE: Jan 08, 1941 77 y.o.  Admit date: 03/20/2018 Discharge date: 03/22/2018  Primary Diagnosis: Primary osteoarthritis right knee   Admission Diagnoses:  Past Medical History:  Diagnosis Date  . Arthritis   . [facility]) 2013   Skin   . Dysrhythmia   . Hypertension    Discharge Diagnoses:   Principal Problem:   OA (osteoarthritis) of knee  Estimated body mass index is 36.48 kg/m as calculated from the following:   Height as of this encounter: 5' 6"  (1.676 m).   Weight as of this encounter: 102.5 kg (226 lb).  Procedure:  Procedure(s) (LRB): RIGHT TOTAL KNEE ARTHROPLASTY (Right)   Consults: None  HPI: Patient is a 77 year old female who presented with knee complaints. The patient reports right knee symptoms including: pain which began over a year ago without any known injury. The patient describes the severity of the symptoms as mild. The patient describes their pain as sharp. Onset of symptoms was with symptoms now occurring intermittently. She did have 2 cortisone injections into her knee (the last injection was August 8th 2018). Ms. Benett is having increasing problems with her RIGHT knee. I previously replaced her LEFT knee she did great with that. RIGHT knee is hurting as badly as the LEFT one did prior to when she had a fixed. It is giving out on her. Limiting what she can and cannot do. The injections have not been beneficial. AP and lateral of the knees show the prosthesis on the LEFT in excellent position with no periprosthetic abnormalities. On the RIGHT she has bone-on-bone arthritis in the medial and patellofemoral compartments. At this point, the most predictable means of improving pain and function is total knee arthroplasty. The procedure, risks, potential complications and rehab course are discussed in detail and the patient elects to proceed.     Laboratory Data: Admission on  03/20/2018  Component Date Value Ref Range Status  . WBC 03/21/2018 10.1  4.0 - 10.5 K/uL Final  . RBC 03/21/2018 3.54* 3.87 - 5.11 MIL/uL Final  . Hemoglobin 03/21/2018 10.5* 12.0 - 15.0 g/dL Final  . HCT 03/21/2018 33.0* 36.0 - 46.0 % Final  . MCV 03/21/2018 93.2  78.0 - 100.0 fL Final  . MCH 03/21/2018 29.7  26.0 - 34.0 pg Final  . MCHC 03/21/2018 31.8  30.0 - 36.0 g/dL Final  . RDW 03/21/2018 13.8  11.5 - 15.5 % Final  . Platelets 03/21/2018 203  150 - 400 K/uL Final   Performed at [facility], [place] [address] [place], [place] [address]  . Sodium 03/21/2018 138  135 - 145 mmol/L Final  . Potassium 03/21/2018 4.7  3.5 - 5.1 mmol/L Final  . Chloride 03/21/2018 107  101 - 111 mmol/L Final  . CO2 03/21/2018 23  22 - 32 mmol/L Final  . Glucose, Bld 03/21/2018 137* 65 - 99 mg/dL Final  . BUN 03/21/2018 22* 6 - 20 mg/dL Final  . Creatinine, Ser 03/21/2018 1.10* 0.44 - 1.00 mg/dL Final  . Calcium 03/21/2018 8.6* 8.9 - 10.3 mg/dL Final  . GFR calc non Af Amer 03/21/2018 46* >60 mL/min Final  . GFR calc Af Amer 03/21/2018 54* >60 mL/min Final   Comment: (NOTE) The eGFR has been calculated using the CKD EPI equation. This calculation has not been validated in all clinical situations. eGFR's persistently <60 mL/min signify possible Chronic Kidney Disease.   Georgiann Hahn gap 03/21/2018  8  5 - 15 Final   Performed at [place] [address] [place], [place] [address]  . WBC 03/22/2018 11.9* 4.0 - 10.5 K/uL Final  . RBC 03/22/2018 3.31* 3.87 - 5.11 MIL/uL Final  . Hemoglobin 03/22/2018 9.9* 12.0 - 15.0 g/dL Final  . HCT 03/22/2018 31.0* 36.0 - 46.0 % Final  . MCV 03/22/2018 93.7  78.0 - 100.0 fL Final  . MCH 03/22/2018 29.9  26.0 - 34.0 pg Final  . MCHC 03/22/2018 31.9  30.0 - 36.0 g/dL Final  . RDW 03/22/2018 14.2  11.5 - 15.5 % Final  . Platelets 03/22/2018 214  150 - 400 K/uL Final   Performed at [facility], [place] [address], [place], [place] [address]  . Sodium 03/22/2018 138  135 - 145 mmol/L Final  . Potassium 03/22/2018 4.9  3.5 - 5.1 mmol/L Final  . Chloride 03/22/2018 107  101 - 111 mmol/L Final  . CO2 03/22/2018 24  22 - 32 mmol/L Final  . Glucose, Bld 03/22/2018 123* 65 - 99 mg/dL Final  . BUN 03/22/2018 31* 6 - 20 mg/dL Final  . Creatinine, Ser 03/22/2018 1.31* 0.44 - 1.00 mg/dL Final  . Calcium 03/22/2018 8.8* 8.9 - 10.3 mg/dL Final  . GFR calc non Af Amer 03/22/2018 38* >60 mL/min Final  . GFR calc Af Amer 03/22/2018 44* >60 mL/min Final   Comment: (NOTE) The eGFR has been calculated using the CKD EPI equation. This calculation has not been validated in all clinical situations. eGFR's persistently <60 mL/min signify possible Chronic Kidney Disease.   Georgiann Hahn gap 03/22/2018 7  5 - 15 Final   Performed at [facility], [place] [address], [place], [place] [address]  Hospital Outpatient Visit on 03/14/2018  Component Date Value Ref Range Status  . aPTT 03/14/2018 34  24 - 36 seconds Final   Performed at [facility], [place] [address] [place], [place] [address]  . WBC 03/14/2018 6.9  4.0 - 10.5 K/uL Final  . RBC 03/14/2018 3.78* 3.87 - 5.11 MIL/uL Final  . Hemoglobin 03/14/2018 11.3* 12.0 - 15.0 g/dL Final  . HCT 03/14/2018 35.2* 36.0 - 46.0 % Final  . MCV 03/14/2018 93.1  78.0 - 100.0 fL Final  . MCH 03/14/2018 29.9  26.0 - 34.0 pg Final  . MCHC 03/14/2018 32.1  30.0 - 36.0 g/dL Final  . RDW 03/14/2018 13.8  11.5 - 15.5 % Final  . Platelets 03/14/2018 214  150 - 400 K/uL Final   Performed at [facility], [place] [address], [place], [place] [address]  . Sodium 03/14/2018 138  135 - 145 mmol/L Final  . Potassium 03/14/2018 4.8  3.5 - 5.1 mmol/L Final  . Chloride 03/14/2018 105  101 - 111 mmol/L Final  . CO2 03/14/2018 24  22 - 32 mmol/L Final  . Glucose, Bld 03/14/2018 95  65 - 99 mg/dL Final  . BUN 03/14/2018 24* 6 - 20 mg/dL Final  .  Creatinine, Ser 03/14/2018 1.26* 0.44 - 1.00 mg/dL Final  . Calcium 03/14/2018 8.9  8.9 - 10.3 mg/dL Final  . Total Protein 03/14/2018 7.4  6.5 - 8.1 g/dL Final  . Albumin 03/14/2018 4.0  3.5 - 5.0 g/dL Final  . AST 03/14/2018 19  15 - 41 U/L Final  . ALT 03/14/2018 14  14 - 54 U/L Final  . Alkaline Phosphatase 03/14/2018 64  38 - 126 U/L Final  . Total  Bilirubin 03/14/2018 0.5  0.3 - 1.2 mg/dL Final  . GFR calc non Af Amer 03/14/2018 39* >60 mL/min Final  . GFR calc Af Amer 03/14/2018 46* >60 mL/min Final   Comment: (NOTE) The eGFR has been calculated using the CKD EPI equation. This calculation has not been validated in all clinical situations. eGFR's persistently <60 mL/min signify possible Chronic Kidney Disease.   Georgiann Hahn gap 03/14/2018 9  5 - 15 Final   Performed at [facility], [place] [address] [place], [place] [address]  . Prothrombin Time 03/14/2018 15.9* 11.4 - 15.2 seconds Final  . INR 03/14/2018 1.28   Final   Performed at [facility], [place] [address] [place], [place] [address]  . ABO/RH(D) 03/14/2018 O NEG   Final  . Antibody Screen 03/14/2018 NEG   Final  . Sample Expiration 03/14/2018 03/23/2018   Final  . Extend sample reason 03/14/2018    Final                   Value:NO TRANSFUSIONS OR PREGNANCY IN THE PAST 3 MONTHS Performed at [facility], [place] [address] [place], [place], [place] [address]   . MRSA, PCR 03/14/2018 NEGATIVE  NEGATIVE Final  . Staphylococcus aureus 03/14/2018 NEGATIVE  NEGATIVE Final   Comment: (NOTE) The Xpert SA Assay (FDA approved for NASAL specimens in patients 3 years of age and older), is one component of a comprehensive surveillance program. It is not intended to diagnose infection nor to guide or monitor treatment. Performed at [facility], [place] [address] [place], [place] [address]    Hospital Course: Taisa Deloria is a 77 y.o. who was admitted to [facility]. They were brought to the operating room on 03/20/2018 and underwent Procedure(s): RIGHT TOTAL KNEE ARTHROPLASTY.  Patient tolerated the procedure well and was later transferred to the recovery room and then to the orthopaedic floor for postoperative care.  They were given PO and IV analgesics for pain control following their surgery.  They were given 24 hours of postoperative antibiotics of  Anti-infectives (From admission, onward)   Start     Dose/Rate Route Frequency Ordered Stop   03/20/18 1300  ceFAZolin (ANCEF) IVPB 2g/100 mL premix     2 g 200 mL/hr over 30 Minutes Intravenous Every 6 hours 03/20/18 1036 03/20/18 1906   03/20/18 0531  ceFAZolin (ANCEF) IVPB 2g/100 mL premix     2 g 200 mL/hr over 30 Minutes Intravenous On call to O.R. 03/20/18 5852 03/20/18 0748     and started on DVT prophylaxis in the form of Eliquis.   PT and OT were ordered for total joint protocol.  Discharge planning consulted to help with postop disposition and equipment needs.  Patient had a good night on the evening of surgery.  They started to get up OOB with therapy on day one. Hemovac drain was pulled without difficulty.  Continued to work with therapy into day two.  Dressing was changed on day two and the incision was clean and dry.  The patient had progressed with therapy and meeting their goals.  Incision was healing well.  Patient was seen in rounds and was ready to go home.   Diet: Cardiac diet Activity:WBAT Follow-up:in 2 weeks Disposition - Home Discharged Condition: stable   Discharge Instructions    Call MD / Call 911   Complete by:  As directed    If you experience chest pain or shortness  of breath, CALL 911 and be transported to the hospital emergency room.  If you develope a fever above 101 F, pus (white drainage) or increased drainage or redness at the wound, or calf pain, call your surgeon's office.   Constipation Prevention   Complete by:  As directed    Drink plenty of fluids.   Prune juice may be helpful.  You may use a stool softener, such as Colace (over the counter) 100 mg twice a day.  Use MiraLax (over the counter) for constipation as needed.   Diet - low sodium heart healthy   Complete by:  As directed    Discharge instructions   Complete by:  As directed    Dr. Gaynelle Arabian Total Joint Specialist [facility] [address] [place], [place] [address] [phone]  TOTAL KNEE REPLACEMENT POSTOPERATIVE DIRECTIONS  Knee Rehabilitation, Guidelines Following Surgery  Results after knee surgery are often greatly improved when you follow the exercise, range of motion and muscle strengthening exercises prescribed by your doctor. Safety measures are also important to protect the knee from further injury. Any time any of these exercises cause you to have increased pain or swelling in your knee joint, decrease the amount until you are comfortable again and slowly increase them. If you have problems or questions, call your caregiver or physical therapist for advice.   HOME CARE INSTRUCTIONS  Remove items at home which could result in a fall. This includes throw rugs or furniture in walking pathways.  ICE to the affected knee every three hours for 30 minutes at a time and then as needed for pain and swelling.  Continue to use ice on the knee for pain and swelling from surgery. You may notice swelling that will progress down to the foot and ankle.  This is normal after surgery.  Elevate the leg when you are not up walking on it.   Continue to use the breathing machine which will help keep your temperature down.  It is common for your temperature to cycle up and down following surgery, especially at night when you are not up moving around and exerting yourself.  The breathing machine keeps your lungs expanded and your temperature down. Do not place pillow under knee, focus on keeping the knee straight while resting  DIET You may resume your previous home diet  once your are discharged from the hospital.  DRESSING / WOUND CARE / SHOWERING You may change your dressing every day with sterile gauze.  Please use good hand washing techniques before changing the dressing.  Do not use any lotions or creams on the incision until instructed by your surgeon. You may start showering once you are discharged home but do not submerge the incision under water. Just pat the incision dry and apply a dry gauze dressing on daily. Change the surgical dressing daily and reapply a dry dressing each time.  ACTIVITY Walk with your walker as instructed. Use walker as long as suggested by your caregivers. Avoid periods of inactivity such as sitting longer than an hour when not asleep. This helps prevent blood clots.  You may resume a sexual relationship in one month or when given the OK by your doctor.  You may return to work once you are cleared by your doctor.  Do not drive a car for 6 weeks or until released by you surgeon.  Do not drive while taking narcotics.  WEIGHT BEARING Weight bearing as tolerated with assist device (walker, cane, etc) as  directed, use it as long as suggested by your surgeon or therapist, typically at least 4-6 weeks.  POSTOPERATIVE CONSTIPATION PROTOCOL Constipation - defined medically as fewer than three stools per week and severe constipation as less than one stool per week.  One of the most common issues patients have following surgery is constipation.  Even if you have a regular bowel pattern at home, your normal regimen is likely to be disrupted due to multiple reasons following surgery.  Combination of anesthesia, postoperative narcotics, change in appetite and fluid intake all can affect your bowels.  In order to avoid complications following surgery, here are some recommendations in order to help you during your recovery period.  Colace (docusate) - Pick up an over-the-counter form of Colace or another stool softener and take twice a day  as long as you are requiring postoperative pain medications.  Take with a full glass of water daily.  If you experience loose stools or diarrhea, hold the colace until you stool forms back up.  If your symptoms do not get better within 1 week or if they get worse, check with your doctor.  Dulcolax (bisacodyl) - Pick up over-the-counter and take as directed by the product packaging as needed to assist with the movement of your bowels.  Take with a full glass of water.  Use this product as needed if not relieved by Colace only.   MiraLax (polyethylene glycol) - Pick up over-the-counter to have on hand.  MiraLax is a solution that will increase the amount of water in your bowels to assist with bowel movements.  Take as directed and can mix with a glass of water, juice, soda, coffee, or tea.  Take if you go more than two days without a movement. Do not use MiraLax more than once per day. Call your doctor if you are still constipated or irregular after using this medication for 7 days in a row.  If you continue to have problems with postoperative constipation, please contact the office for further assistance and recommendations.  If you experience "the worst abdominal pain ever" or develop nausea or vomiting, please contact the office immediatly for further recommendations for treatment.  ITCHING  If you experience itching with your medications, try taking only a single pain pill, or even half a pain pill at a time.  You can also use Benadryl over the counter for itching or also to help with sleep.   TED HOSE STOCKINGS Wear the elastic stockings on both legs for three weeks following surgery during the day but you may remove then at night for sleeping.  MEDICATIONS See your medication summary on the "After Visit Summary" that the nursing staff will review with you prior to discharge.  You may have some home medications which will be placed on hold until you complete the course of blood thinner medication.   It is important for you to complete the blood thinner medication as prescribed by your surgeon.  Continue your approved medications as instructed at time of discharge.  PRECAUTIONS If you experience chest pain or shortness of breath - call 911 immediately for transfer to the hospital emergency department.  If you develop a fever greater that 101 F, purulent drainage from wound, increased redness or drainage from wound, foul odor from the wound/dressing, or calf pain - CONTACT YOUR SURGEON.  FOLLOW-UP APPOINTMENTS Make sure you keep all of your appointments after your operation with your surgeon and caregivers. You should call the office at the above phone number and make an appointment for approximately two weeks after the date of your surgery or on the date instructed by your surgeon outlined in the "After Visit Summary".   RANGE OF MOTION AND STRENGTHENING EXERCISES  Rehabilitation of the knee is important following a knee injury or an operation. After just a few days of immobilization, the muscles of the thigh which control the knee become weakened and shrink (atrophy). Knee exercises are designed to build up the tone and strength of the thigh muscles and to improve knee motion. Often times heat used for twenty to thirty minutes before working out will loosen up your tissues and help with improving the range of motion but do not use heat for the first two weeks following surgery. These exercises can be done on a training (exercise) mat, on the floor, on a table or on a bed. Use what ever works the best and is most comfortable for you Knee exercises include:  Leg Lifts - While your knee is still immobilized in a splint or cast, you can do straight leg raises. Lift the leg to 60 degrees, hold for 3 sec, and slowly lower the leg. Repeat 10-20 times 2-3 times daily. Perform this exercise against resistance later as your knee gets better.  Quad and  Hamstring Sets - Tighten up the muscle on the front of the thigh (Quad) and hold for 5-10 sec. Repeat this 10-20 times hourly. Hamstring sets are done by pushing the foot backward against an object and holding for 5-10 sec. Repeat as with quad sets.  Leg Slides: Lying on your back, slowly slide your foot toward your buttocks, bending your knee up off the floor (only go as far as is comfortable). Then slowly slide your foot back down until your leg is flat on the floor again. Angel Wings: Lying on your back spread your legs to the side as far apart as you can without causing discomfort.  A rehabilitation program following serious knee injuries can speed recovery and prevent re-injury in the future due to weakened muscles. Contact your doctor or a physical therapist for more information on knee rehabilitation.   IF YOU ARE TRANSFERRED TO A SKILLED REHAB FACILITY If the patient is transferred to a skilled rehab facility following release from the hospital, a list of the current medications will be sent to the facility for the patient to continue.  When discharged from the skilled rehab facility, please have the facility set up the patient's [place] prior to being released. Also, the skilled facility will be responsible for providing the patient with their medications at time of release from the facility to include their pain medication, the muscle relaxants, and their blood thinner medication. If the patient is still at the rehab facility at time of the two week follow up appointment, the skilled rehab facility will also need to assist the patient in arranging follow up appointment in our office and any transportation needs.  MAKE SURE YOU:  Understand these instructions.  Get help right away if you are not doing well or get worse.    Pick up stool softner and laxative for home use following surgery while on pain medications. Do not submerge incision under water. Please use good hand  washing techniques while changing dressing each day. May shower starting three days after surgery.  Please use a clean towel to pat the incision dry following showers. Continue to use ice for pain and swelling after surgery. Do not use any lotions or creams on the incision until instructed by your surgeon.   Do not put a pillow under the knee. Place it under the heel.   Complete by:  As directed    Increase activity slowly as tolerated   Complete by:  As directed    TED hose   Complete by:  As directed    Use stockings (TED hose) for 3 weeks on operative leg(s).  You may remove them at night for sleeping.     Allergies as of 03/22/2018      Reactions   Vicodin [hydrocodone-acetaminophen] Other (See Comments)   Tearful and Pacing      Medication List    TAKE these medications   amiodarone 200 MG tablet Commonly known as:  PACERONE Take 200 mg by mouth daily.   Calcium-Vitamin D3 600-400 MG-UNIT Tabs Take 1 tablet by mouth daily.   ELIQUIS 5 MG Tabs tablet Generic drug:  apixaban Take 5 mg by mouth 2 (two) times daily.   gabapentin 100 MG capsule Commonly known as:  NEURONTIN Take 300 mg by mouth 3 (three) times daily.   hydrochlorothiazide 12.5 MG capsule Commonly known as:  MICROZIDE Take 12.5 mg by mouth daily.   lisinopril 10 MG tablet Commonly known as:  PRINIVIL,ZESTRIL Take 10 mg by mouth daily.   methocarbamol 500 MG tablet Commonly known as:  ROBAXIN Take 1 tablet (500 mg total) by mouth every 6 (six) hours as needed for muscle spasms.   metoprolol succinate 25 MG 24 hr tablet Commonly known as:  TOPROL-XL Take 25 mg by mouth daily.   oxyCODONE 5 MG immediate release tablet Commonly known as:  Oxy IR/ROXICODONE Take 1-2 tablets (5-10 mg total) by mouth every 6 (six) hours as needed for moderate pain (pain score 4-6).   pravastatin 40 MG tablet Commonly known as:  PRAVACHOL Take 40 mg by mouth at bedtime.   traMADol 50 MG tablet Commonly known as:   ULTRAM Take 1-2 tablets (50-100 mg total) by mouth every 6 (six) hours as needed for moderate pain (not relieved with oxycodone).   valACYclovir 1000 MG tablet Commonly known as:  VALTREX Take 1,000 mg by mouth daily as needed (for outbreaks).   vitamin B-12 500 MCG tablet Commonly known as:  CYANOCOBALAMIN Take 500 mcg by mouth daily.            Durable Medical Equipment  (From admission, onward)        Start     Ordered   03/21/18 1103  For home use only DME Walker rolling  Once    Question:  Patient needs a walker to treat with the following condition  Answer:  S/P knee surgery   03/21/18 1103     Follow-up Information    Gaynelle Arabian, MD. Schedule an appointment as soon as possible for a visit on 04/04/2018.   Specialty:  Orthopedic Surgery Contact information: [address] [place] [address] [place] [address] [phone]        [facility] - Dme Follow up.   Why:  walker Contact information: [address] [place] [address] [phone]           Signed: [place] Jourdain, PA-C Orthopaedic Surgery 03/22/2018, 11:13 AM

## 2018-03-22 NOTE — Progress Notes (Signed)
   Subjective: 2 Days Post-Op Procedure(s) (LRB): RIGHT TOTAL KNEE ARTHROPLASTY (Right) Patient reports pain as mild.   Patient seen in rounds with Dr. Wynelle Link. Patient is well, and has had no acute complaints or problems. Voiding. No issues overnight.  Plan is to go Home after hospital stay.  Objective: Vital signs in last 24 hours: Temp:  [97.4 F (36.3 C)-98.1 F (36.7 C)] 98 F (36.7 C) (05/01 0600) Pulse Rate:  [47-51] 48 (05/01 0600) Resp:  [16-24] 18 (05/01 0600) BP: (153-186)/(52-65) 156/64 (05/01 0600) SpO2:  [94 %-97 %] 97 % (05/01 0600)  Intake/Output from previous day:  Intake/Output Summary (Last 24 hours) at 03/22/2018 0715 Last data filed at 03/22/2018 0603 Gross per 24 hour  Intake 900 ml  Output 200 ml  Net 700 ml    Intake/Output this shift: No intake/output data recorded.  Labs: Recent Labs    03/21/18 0609 03/22/18 0554  HGB 10.5* 9.9*   Recent Labs    03/21/18 0609 03/22/18 0554  WBC 10.1 11.9*  RBC 3.54* 3.31*  HCT 33.0* 31.0*  PLT 203 214   Recent Labs    03/21/18 0609 03/22/18 0554  NA 138 138  K 4.7 4.9  CL 107 107  CO2 23 24  BUN 22* 31*  CREATININE 1.10* 1.31*  GLUCOSE 137* 123*  CALCIUM 8.6* 8.8*    EXAM General - Patient is Alert and Oriented Extremity - Neurologically intact Intact pulses distally Dorsiflexion/Plantar flexion intact No cellulitis present Compartment soft Dressing/Incision - clean, dry, no drainage Motor Function - intact, moving foot and toes well on exam.   Past Medical History:  Diagnosis Date  . Arthritis   . 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 Dysrhythmia   . Hypertension     Assessment/Plan: 2 Days Post-Op Procedure(s) (LRB): RIGHT TOTAL KNEE ARTHROPLASTY (Right) Principal Problem:   OA (osteoarthritis) of knee  Estimated body mass index is 36.48 kg/m as calculated from the following:   Height as of this encounter: 5\' 6"  (1.676 m).   Weight as of this encounter: 102.5 kg (226  lb). Advance diet Up with therapy  DVT Prophylaxis - Eliquis Weight-Bearing as tolerated    Ardeen Jourdain, PA-C Orthopaedic Surgery 03/22/2018, 7:15 AM
